# Patient Record
Sex: Female | Born: 1935 | Race: White | Hispanic: No | Smoking: Never smoker
Health system: Southern US, Community
[De-identification: ages and names within clinical notes are randomized; demographics above are authoritative.]

## PROBLEM LIST (undated history)

## (undated) DIAGNOSIS — I340 Nonrheumatic mitral (valve) insufficiency: Secondary | ICD-10-CM

## (undated) DIAGNOSIS — I272 Pulmonary hypertension, unspecified: Secondary | ICD-10-CM

## (undated) DIAGNOSIS — IMO0002 Reserved for concepts with insufficient information to code with codable children: Secondary | ICD-10-CM

## (undated) DIAGNOSIS — M810 Age-related osteoporosis without current pathological fracture: Secondary | ICD-10-CM

## (undated) DIAGNOSIS — I10 Essential (primary) hypertension: Secondary | ICD-10-CM

## (undated) HISTORY — PX: SPINAL FUSION: SHX223

## (undated) HISTORY — PX: FEMUR FRACTURE SURGERY: SHX633

---

## 1999-04-18 ENCOUNTER — Other Ambulatory Visit: Admission: RE | Admit: 1999-04-18 | Discharge: 1999-04-18 | Payer: Self-pay | Admitting: Internal Medicine

## 2000-05-20 ENCOUNTER — Encounter: Payer: Self-pay | Admitting: Gastroenterology

## 2000-05-20 ENCOUNTER — Ambulatory Visit (HOSPITAL_COMMUNITY): Admission: RE | Admit: 2000-05-20 | Discharge: 2000-05-20 | Payer: Self-pay | Admitting: Gastroenterology

## 2001-04-14 ENCOUNTER — Encounter: Admission: RE | Admit: 2001-04-14 | Discharge: 2001-04-14 | Payer: Self-pay | Admitting: Internal Medicine

## 2001-04-14 ENCOUNTER — Encounter: Payer: Self-pay | Admitting: Internal Medicine

## 2003-11-06 ENCOUNTER — Ambulatory Visit: Payer: Self-pay | Admitting: Physical Medicine & Rehabilitation

## 2003-11-06 ENCOUNTER — Inpatient Hospital Stay (HOSPITAL_COMMUNITY): Admission: EM | Admit: 2003-11-06 | Discharge: 2003-11-13 | Payer: Self-pay | Admitting: Emergency Medicine

## 2004-02-05 ENCOUNTER — Encounter: Admission: RE | Admit: 2004-02-05 | Discharge: 2004-02-05 | Payer: Self-pay | Admitting: Internal Medicine

## 2004-11-08 ENCOUNTER — Encounter: Admission: RE | Admit: 2004-11-08 | Discharge: 2004-11-08 | Payer: Self-pay | Admitting: Internal Medicine

## 2005-05-22 ENCOUNTER — Emergency Department (HOSPITAL_COMMUNITY): Admission: EM | Admit: 2005-05-22 | Discharge: 2005-05-22 | Payer: Self-pay | Admitting: Emergency Medicine

## 2005-06-06 ENCOUNTER — Encounter: Admission: RE | Admit: 2005-06-06 | Discharge: 2005-06-06 | Payer: Self-pay | Admitting: Internal Medicine

## 2005-08-27 ENCOUNTER — Encounter: Admission: RE | Admit: 2005-08-27 | Discharge: 2005-08-27 | Payer: Self-pay | Admitting: Internal Medicine

## 2005-09-18 IMAGING — CR DG CHEST 1V
1 series · 1 of 1 positions shown · non-contrast
Comparison: none

CLINICAL DATA: Fall. 
 CHEST ONE VIEW 
 An AP view of the chest dated 11/06/03 is reviewed without prior films for comparison.  The study was originally dictated on 11/06/03 but is being re-dictated on 11/13/03 due to lost report.  
 The heart is enlarged without evidence of congestive failure.  The left costophrenic area is not included on the film entirely and repeat study is suggested when the patient?s condition permits. 
 IMPRESSION
 Cardiomegaly without acute abnormality of the chest with followup exam suggested.

[view not recorded]
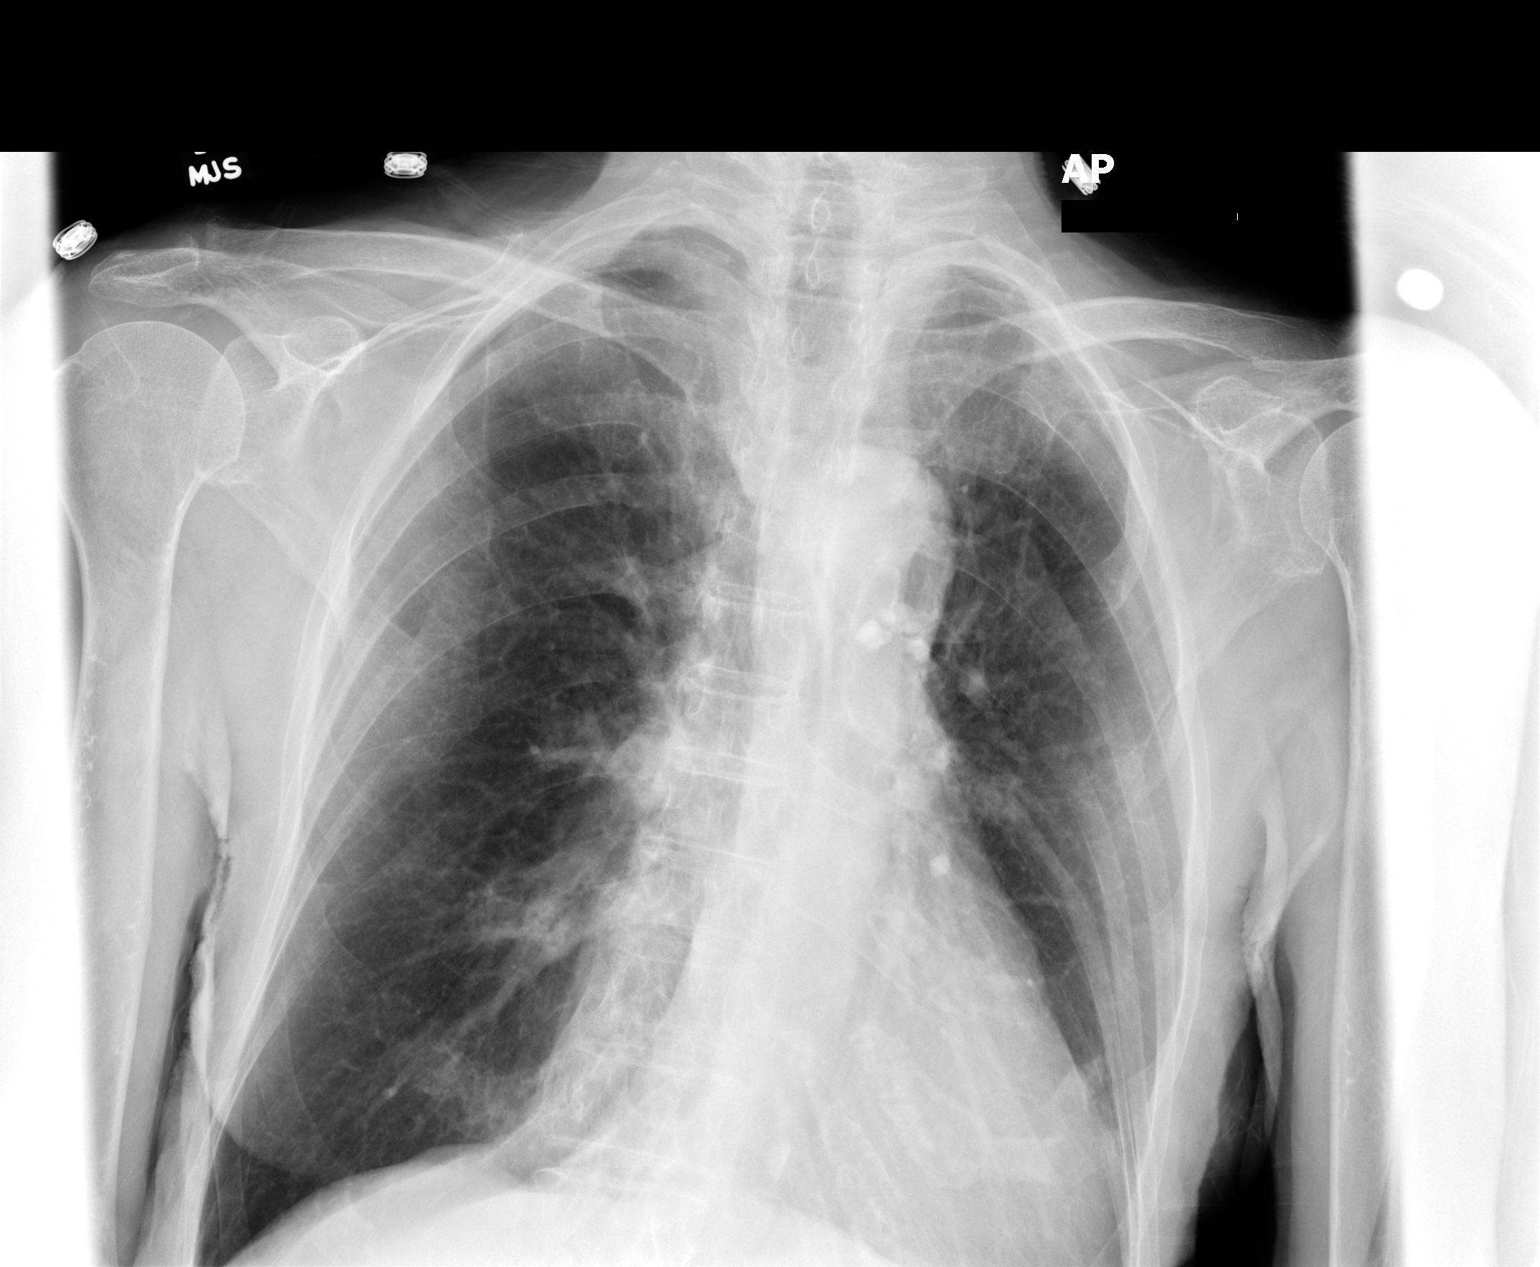

[1 of 1 positions shown; findings below may reference images not displayed]

## 2005-09-18 IMAGING — CR DG HIP (WITH OR WITHOUT PELVIS) 2-3V*L*
2 series · 2 of 2 positions shown · non-contrast
Comparison: none

CLINICAL DATA: Fall, left hip pain.
 LEFT HIP COMPLETE:
 Two views of the left hip dated 11/06/03 are redictated on 11/20/03 due to inability to retrieve the report, which was dictated on the date of the exam.  
 Note is made of fracture of the subtrochanteric portion of the femur is non displaced.  Degenerative change is noted otherwise without other acute change.

[view not recorded (1 of 2)]
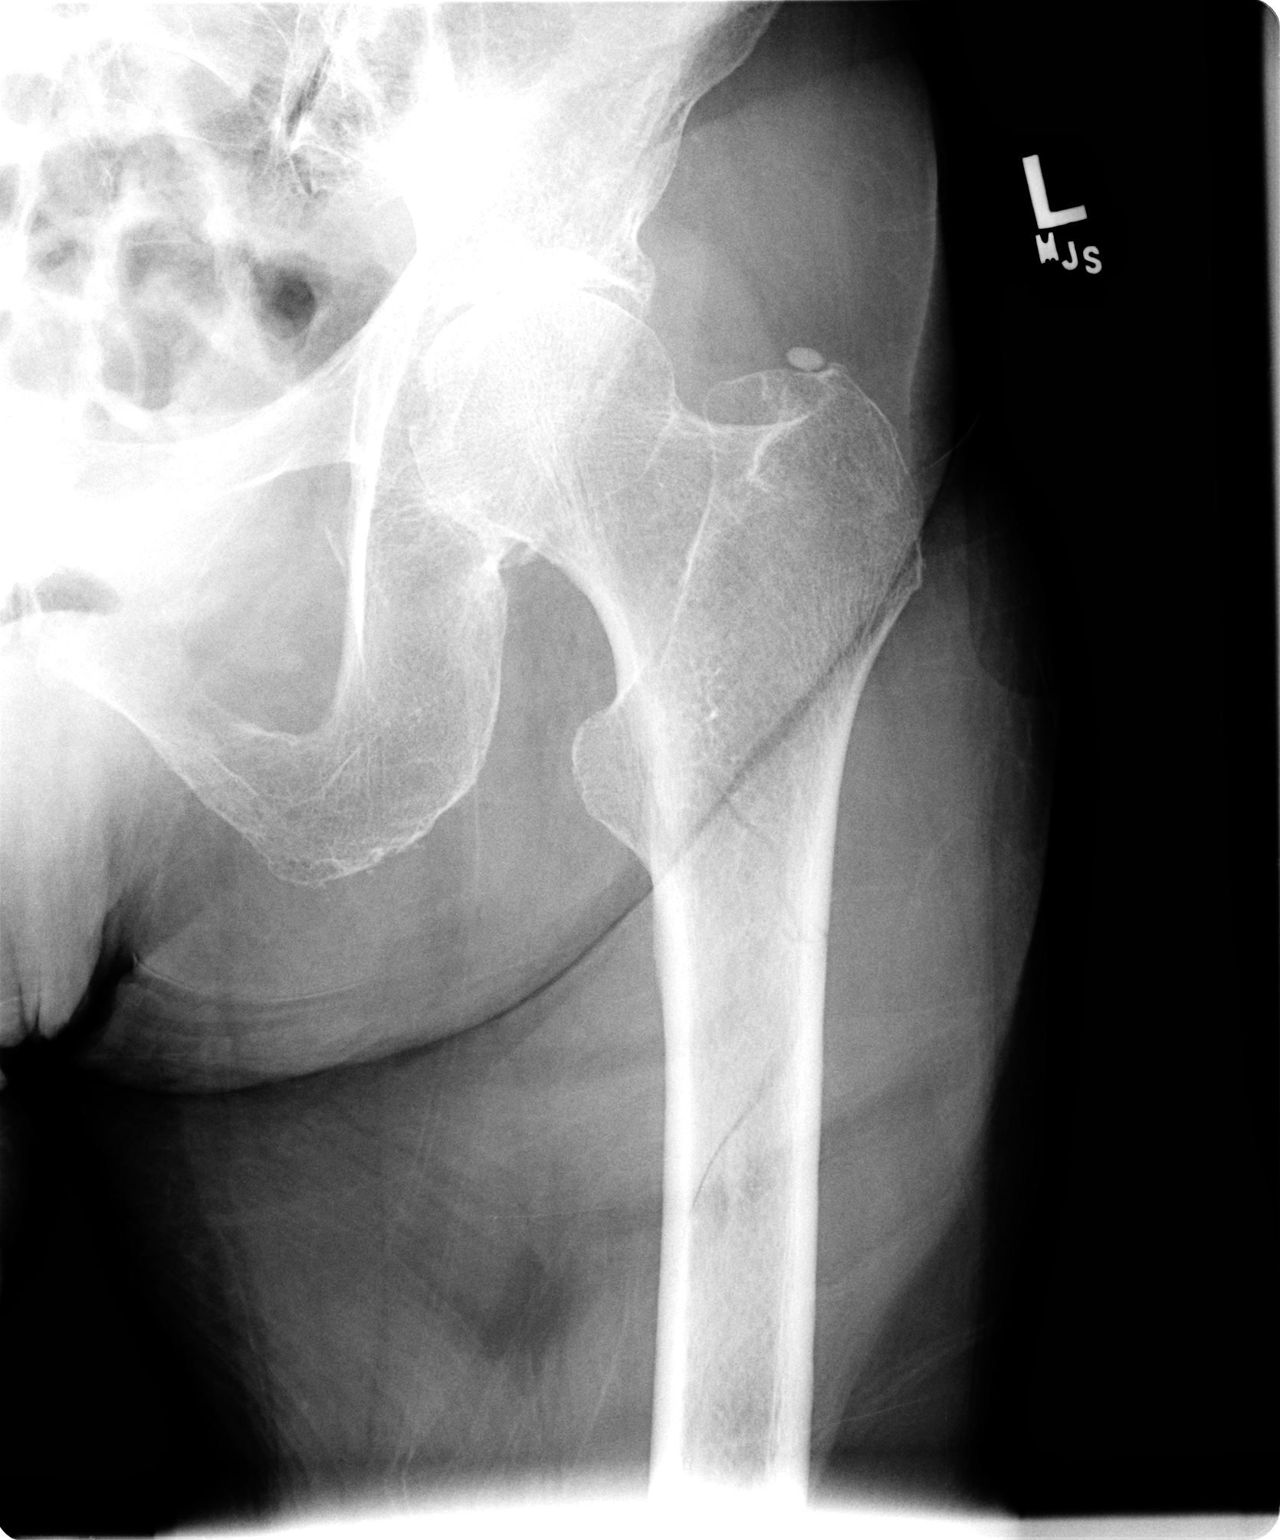

[view not recorded (2 of 2)]
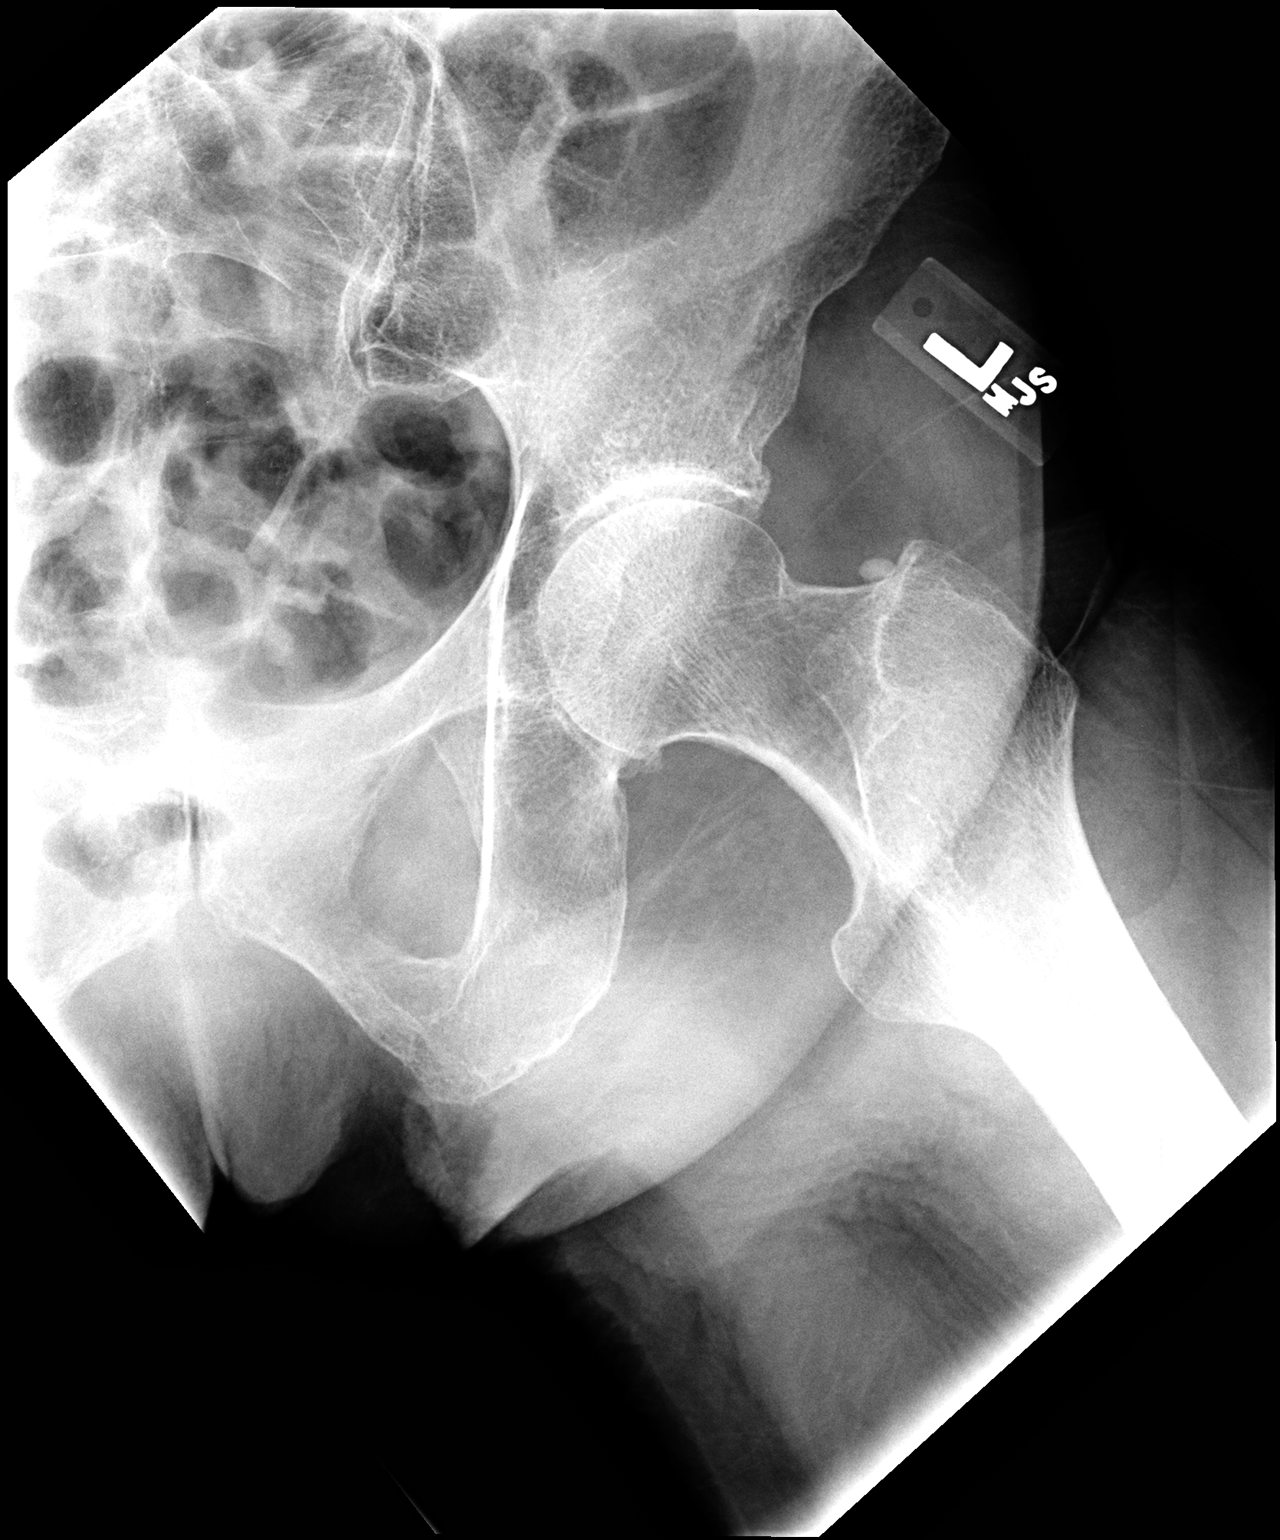

[2 of 2 positions shown; findings below may reference images not displayed]

IMPRESSION: Non-displaced subtrochanteric fracture, left femur, with discussion as above.

## 2005-09-18 IMAGING — CR DG PELVIS 1-2V
1 series · 1 of 1 positions shown · non-contrast
Comparison: none

CLINICAL DATA: Fall.  Pain in left hip. 
 PELVIS ONE TO TWO VIEWS 
 AP view of the pelvis reveals irregularity of the proximal left femur in the diaphyseal area extending into the greater trochanter region.  This is thought to represent a nondisplaced fracture.  Degenerative changes noted otherwise without other acute change. 
 IMPRESSION
 Findings thought to represent a nondisplaced fracture of the proximal left femur with discussion as above.

[view not recorded]
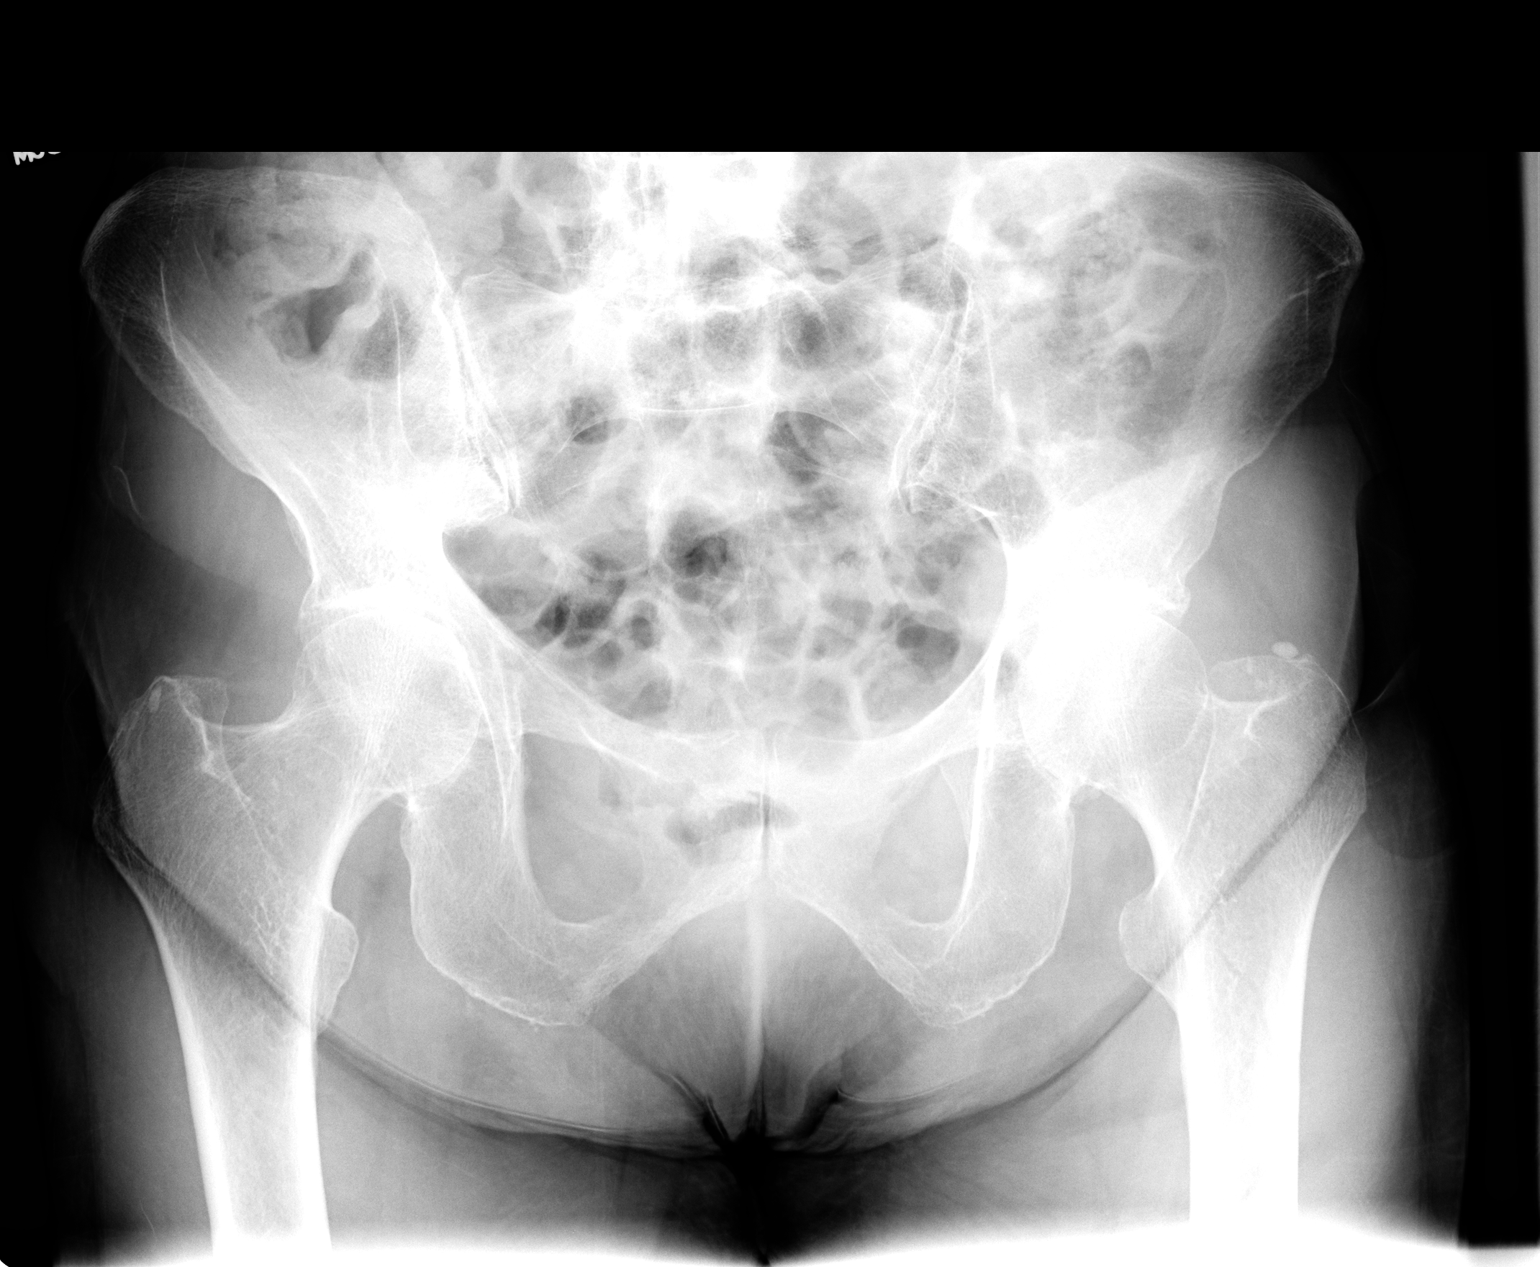

[1 of 1 positions shown; findings below may reference images not displayed]

## 2005-12-19 ENCOUNTER — Emergency Department (HOSPITAL_COMMUNITY): Admission: EM | Admit: 2005-12-19 | Discharge: 2005-12-19 | Payer: Self-pay | Admitting: Emergency Medicine

## 2005-12-29 ENCOUNTER — Encounter: Admission: RE | Admit: 2005-12-29 | Discharge: 2005-12-29 | Payer: Self-pay | Admitting: *Deleted

## 2006-05-29 ENCOUNTER — Encounter: Admission: RE | Admit: 2006-05-29 | Discharge: 2006-05-29 | Payer: Self-pay | Admitting: *Deleted

## 2007-09-14 ENCOUNTER — Other Ambulatory Visit: Payer: Self-pay

## 2007-09-14 ENCOUNTER — Ambulatory Visit: Payer: Self-pay | Admitting: Ophthalmology

## 2007-09-29 ENCOUNTER — Ambulatory Visit: Payer: Self-pay | Admitting: Ophthalmology

## 2007-11-03 ENCOUNTER — Ambulatory Visit: Payer: Self-pay | Admitting: Ophthalmology

## 2008-02-28 ENCOUNTER — Encounter: Admission: RE | Admit: 2008-02-28 | Discharge: 2008-02-28 | Payer: Self-pay | Admitting: Family Medicine

## 2008-03-11 ENCOUNTER — Emergency Department (HOSPITAL_COMMUNITY): Admission: EM | Admit: 2008-03-11 | Discharge: 2008-03-11 | Payer: Self-pay | Admitting: Emergency Medicine

## 2010-01-22 IMAGING — CR DG HIP (WITH OR WITHOUT PELVIS) 2-3V*L*
3 series · 3 of 3 positions shown · non-contrast
Comparison: Intraoperative exam 11/06/2003.

CLINICAL DATA: Left hip pain.  No new injury.

LEFT HIP - COMPLETE 2+ VIEW

[t pelvis a.p.]
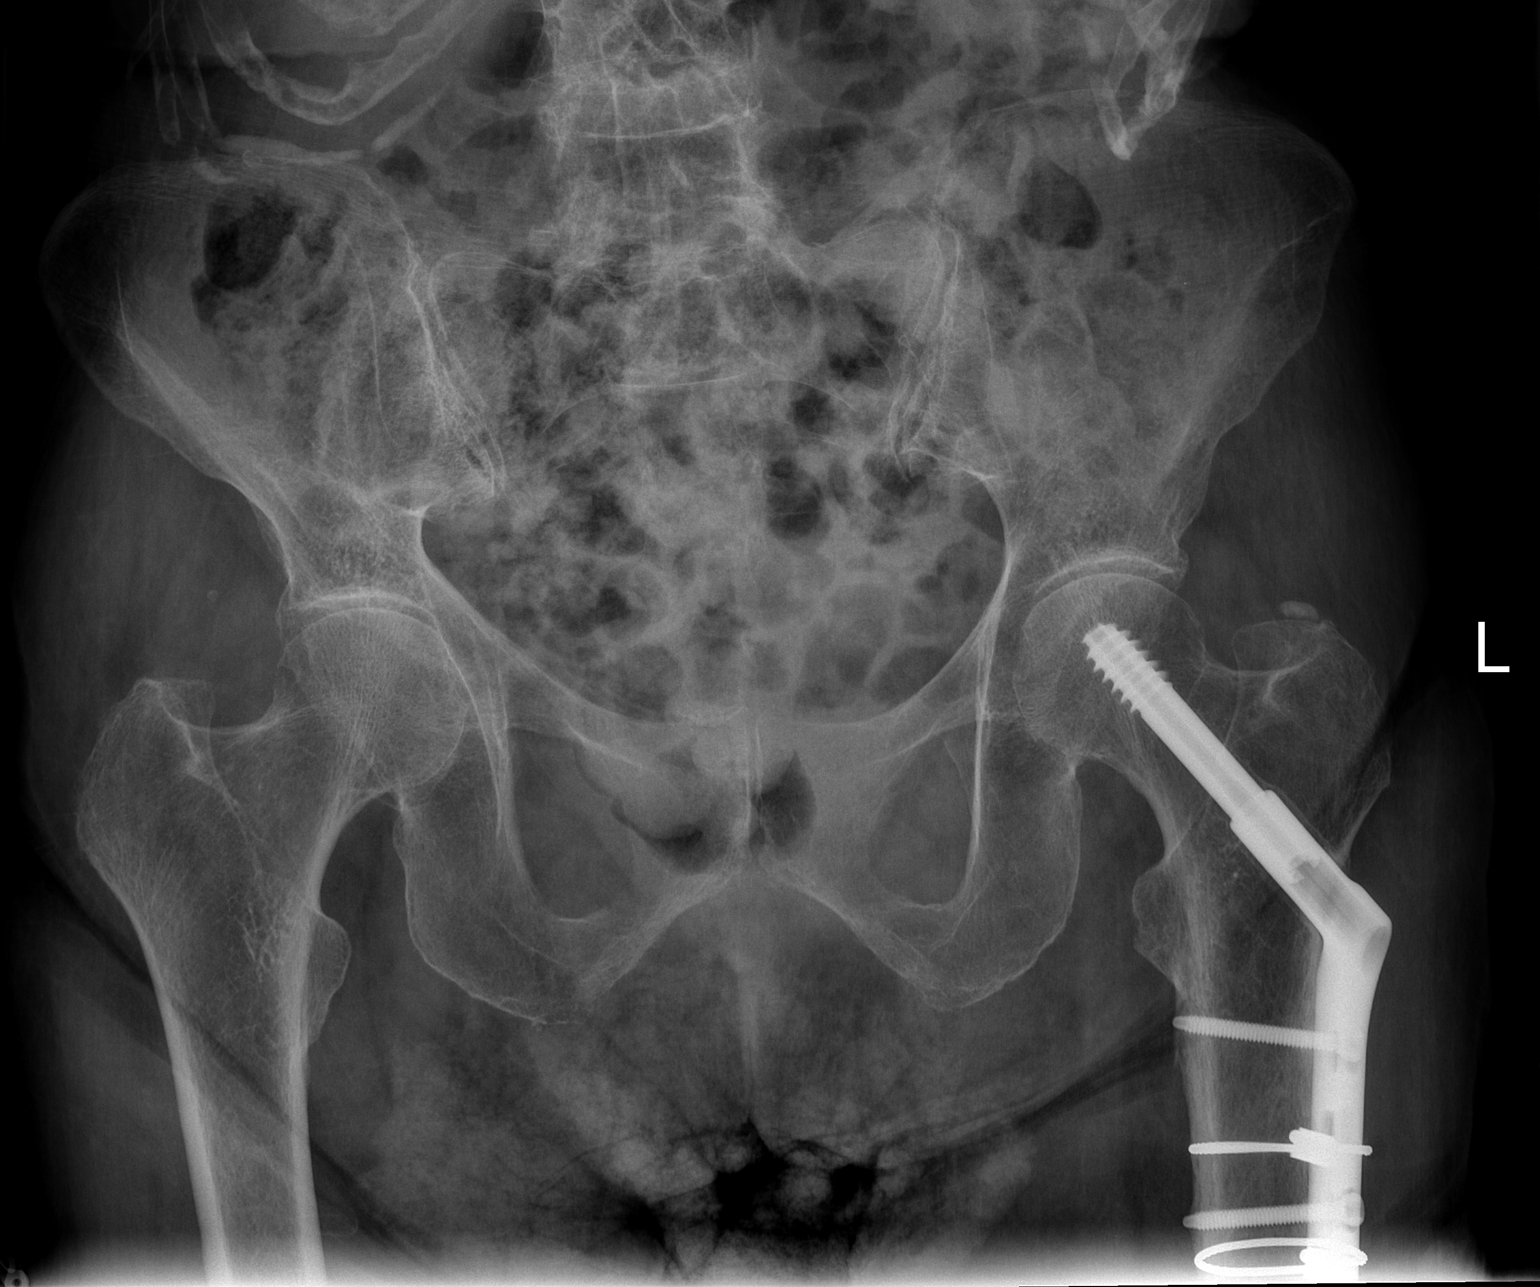

[t hip ap left]
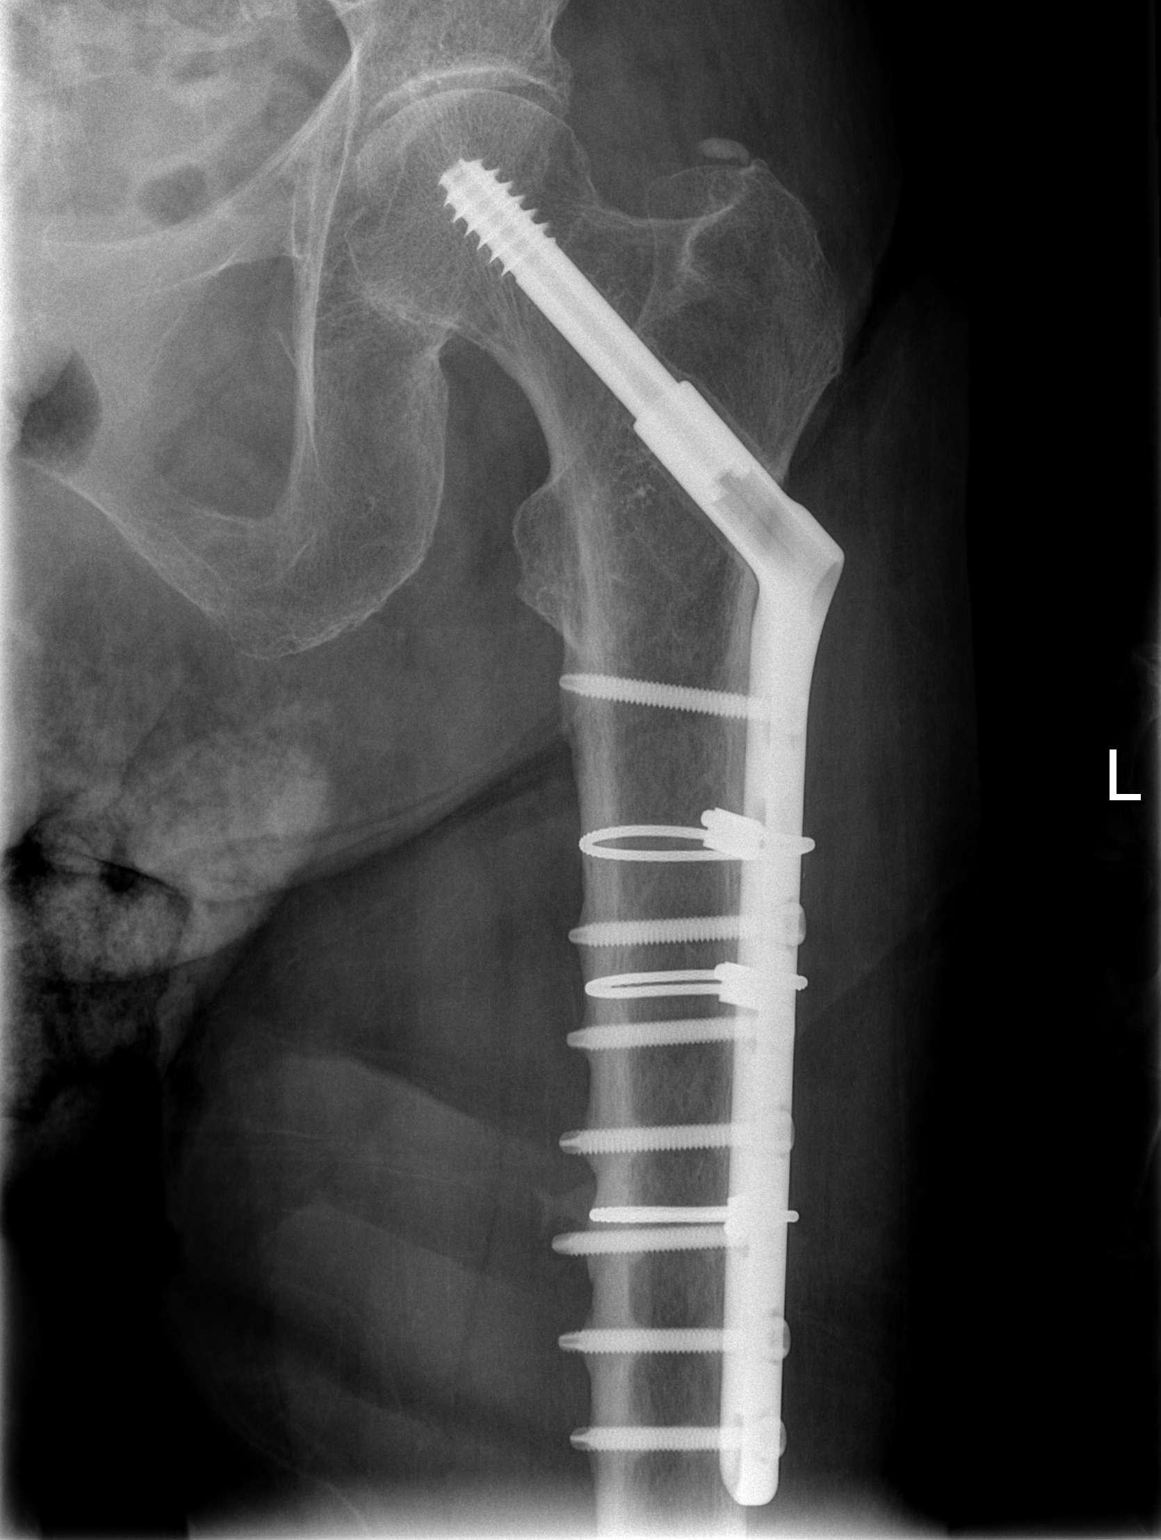

[t hip frog leg left]
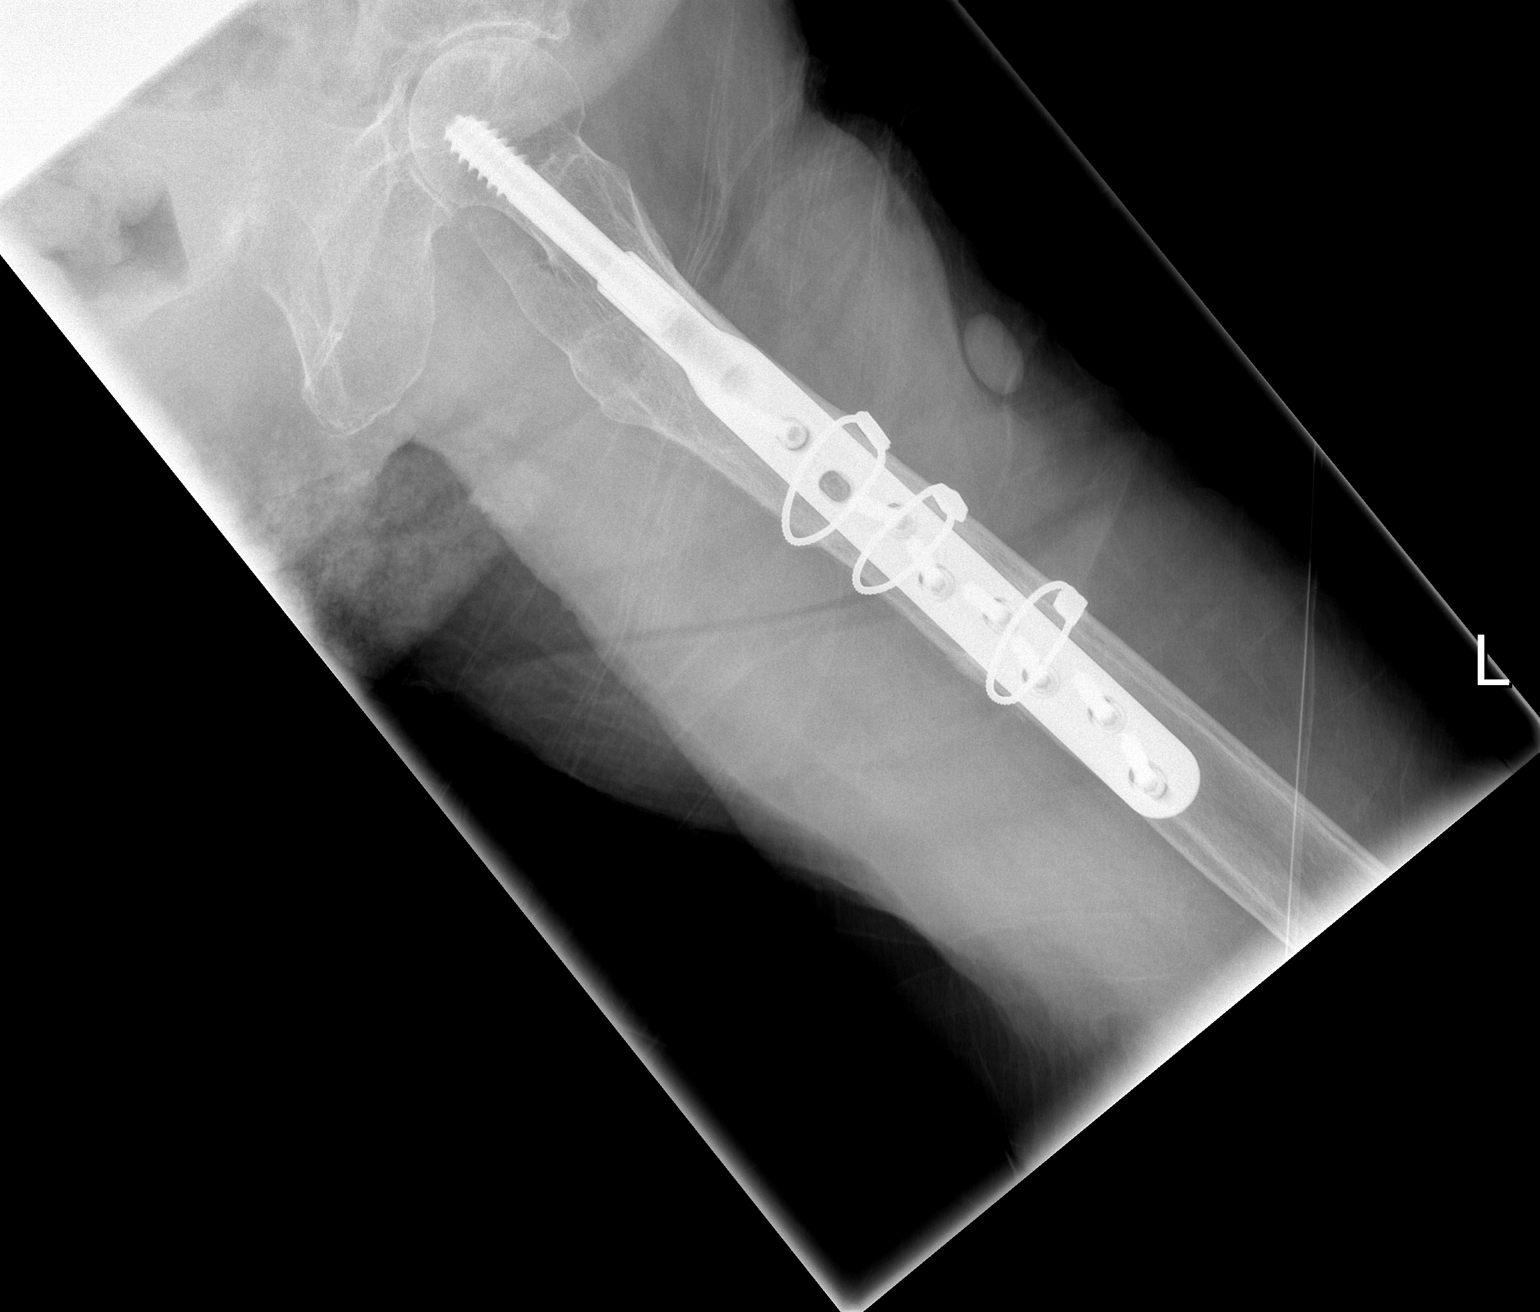

[3 of 3 positions shown; findings below may reference images not displayed]

FINDINGS: Status post placement of a dynamic compression screw left
proximal femur without findings to suggest loosening.  Osteopenic
bones without acute fracture.  Mild bilateral hip joint
degenerative changes.
IMPRESSION: Prior left hip surgery.

No acute fracture.

## 2010-02-09 ENCOUNTER — Encounter: Payer: Self-pay | Admitting: Internal Medicine

## 2010-05-07 LAB — URINALYSIS, ROUTINE W REFLEX MICROSCOPIC
Bilirubin Urine: NEGATIVE
Glucose, UA: NEGATIVE mg/dL
Hgb urine dipstick: NEGATIVE
Ketones, ur: NEGATIVE mg/dL
Nitrite: NEGATIVE
Protein, ur: NEGATIVE mg/dL
Specific Gravity, Urine: 1.011 (ref 1.005–1.030)
Urobilinogen, UA: 0.2 mg/dL (ref 0.0–1.0)
pH: 7.5 (ref 5.0–8.0)

## 2010-06-07 NOTE — Consult Note (Signed)
NAMEANNALEAH, ARATA                 ACCOUNT NO.:  [id number]   MEDICAL RECORD NO.:  [id number]          PATIENT TYPE:  INP   LOCATION:  1824                         FACILITY:  [facility]   PHYSICIAN:  Meade Maw, M.D.    DATE OF BIRTH:  02-Apr-1914   DATE OF CONSULTATION:  11/06/2003  DATE OF DISCHARGE:                                   CONSULTATION   INDICATIONS FOR CONSULT:  Preoperative evaluation prior to hip replacement.   Sharon Harrison is a very pleasant 77 year old female known to me from previous  evaluations for mitral regurgitation.  Her last echo was in July 2002 and  revealed moderately severe mitral regurgitation with normal PA pressures.  She was noted to have mild left atrial enlargement with a left atrium of  4.3.  She was last evaluated by me in 2003, she did not keep subsequent  follow up.  She states she has been in her usual state of health.  She has  been active.  [place]  approximately one mile yesterday.  She has had no change in her exercise  tolerance.  She has had no chest pain, no shortness of breath, no orthopnea,  no palpitations, no tachy arrhythmias.  She does have pedal edema which has  been chronic in nature and for which she wears compression stockings.   PAST MEDICAL HISTORY:  As noted above, significant for  1.  Mitral valve prolapse.  2.  Hypertension.  3.  Osteoporosis.  4.  Over active bladder.  5.  Dermatitis.   PAST SURGICAL HISTORY:  Lumbar spinal fusion and vein stripping, she had no  complications with these procedures.   CURRENT MEDICATIONS:  OS-Cal 500 mg daily, E-Vista 60 mg daily, Lisinopril 5  mg in the morning, Detrol in the morning, aspirin 81 mg daily, Tums b.i.d.,  and multi-vitamins.   ALLERGIES:  Codeine.   REVIEW OF SYMPTOMS:  As noted above.   SOCIAL HISTORY:  She has been widowed two years.  She lives independently.  She does not drive.  No history of tobacco, a small glass  of wine prior to  dinner.   PHYSICAL EXAMINATION:  GENERAL:  Elderly female in no acute distress except for the pain in her  left hip.  VITAL SIGNS:  Blood pressure 156/85, heart rate 81, respiratory rate 18.  She is afebrile.  NEUROLOGICAL:  Nonfocal, conversation appropriate.  HEENT:  Unremarkable.  NECK:  There is no carotid bruits, no neck vein distention.  PULMONARY EXAM:  Breath sounds which are equal and clear to auscultation.  No use of accessory muscles.  CARDIOVASCULAR EXAM:  Normal S1 and normal S2, there is a 2/6 systolic  murmur most noted actually at the left lower sternal border.  PMI is not  displaced.  ABDOMEN:  Soft, benign, nontender, no unusual bruits or pulsations noted.  EXTREMITIES:  Distal pulses are equal and palpable.  SKIN:  Warm and dry.   LABORATORY DATA:  Hematocrit 40.5, platelet count 196, white count 10.7.  Potassium 3.1, creatinine 0.8, glucose 127.  Liver function tests normal.  ECG reveals normal sinus rhythm with late R wave progression unchanged from  2002.   IMPRESSION:  39.  77 year old female with known moderate to moderately severe mitral      regurgitation with normal PA pressures.  She does have mild left atrial      enlargement.  In view of her advanced age, hypertension, mitral      regurgitation, and mild left atrial enlargement, she will be at      increased risk of atrial arrhythmias, therefore, would recommend      potassium supplementation and beta block burst during the perioperative      period.  No further cardiac workup needs to be performed at this time.      Close attention to fluid shifts and she will be at a higher risk of CHF      exacerbation in the postoperative period, as well.  2.  Hypertension, blood pressure marginally control, recommend close      attention to pain management.  3.  Health maintenance, if she has not had a recent cholesterol profile,      this will need to be performed.      Hele   HP/MEDQ   D:  11/06/2003  T:  11/06/2003  Job:  [id number]   cc:   Darius Bump, M.D.  [place]  [place] [address]  Fax: [phone]   Lenard Galloway. Chaney Malling, M.D.  [address] [place]  [place] [address]  Fax: [phone]

## 2010-06-07 NOTE — Op Note (Signed)
Sharon Harrison, Sharon Harrison                 ACCOUNT NO.:  [id number]   MEDICAL RECORD NO.:  [id number]          PATIENT TYPE:  INP   LOCATION:  1824                         FACILITY:  [facility]   PHYSICIAN:  Rodney A. Mortenson, M.D.DATE OF BIRTH:  1914/07/10   DATE OF PROCEDURE:  11/06/2003  DATE OF DISCHARGE:                                 OPERATIVE REPORT   PREOPERATIVE DIAGNOSIS:  Nondisplaced subtrochanteric fracture, left hip.   POSTOPERATIVE DIAGNOSIS:  Nondisplaced subtrochanteric fracture, left hip.   OPERATION PERFORMED:  Open reduction internal fixation, subtrochanteric  fracture, left hip using 8-hole side plate with 161 degree compression screw  and three Dall-Miles cables.   SURGEON:  Lenard Galloway. Chaney Malling, M.D.   ASSISTANT:  Legrand Pitts. Duffy, P.A.   ANESTHESIA:  General.   DESCRIPTION OF PROCEDURE:  After satisfactory general anesthesia the patient  was placed on the fracture table in the supine position.  The foot was  placed in traction device.  The left thigh and hip was then prepped with  Betadine and then draped out in the usual manner.  A barrier drape was  placed over the hip and held up with curtain rods.  Throughout the procedure  the C-arm was used.  An incision was made starting at about the level of the  greater trochanter and carried distally for about 8 inches.  Skin edges were  retracted, bleeders were coagulated.  Tensor fascia lata was split as were  the fibers of the vastus lateralis.  The lateral cortex of the femur was  then exposed with a Cobb.  Drill hole was placed at the level of  the lesser  trochanter and a guide pin was passed up the femoral neck at about 135  degree angle using angled guide.  The step cut cortical drill was then used  to drill up into the femoral neck.  A 75 mm compression screw was passed  over the guide pin and an 8-hole 135 degree side plate was applied to the  compression screw.  This held up flush against the femur with a [place]  claw  clamp.  Drill holes were placed and appropriate length screws were inserted.  Three of the screws did not obtain good purchase as they were through the  nondisplaced fracture line.  Proximally and distally there was good fixation  with the titanium screws.  Three Dall-Miles cables were then placed around  the femur and the side plate in standard fashion, tightened up snugly and  excess cable was cut.  Excellent fixation was achieved.  Throughout the  procedure, radiographs were used to stabilize and position all the  components.  The fibers of the vastus lateralis were closed with a running  Vicryl suture.  Tycron was used in an interrupted manner to close the tensor  fascia lata and Vicryl was used to close the subcutaneous tissue.  Stainless  steel staples used to close the skin.  Sterile dressings were applied with a  6 inch Ace  and the patient was then transferred to the recovery room in excellent  condition.  Technically this  procedure went extremely well.   ESTIMATED BLOOD LOSS:  Approximately 500 mL.       RAM/MEDQ  D:  11/06/2003  T:  11/07/2003  Job:  [id number]

## 2010-06-07 NOTE — Procedures (Signed)
Sharon Harrison. [facility]  Patient:    Sharon Harrison, Sharon Harrison                          MRN: [id number] Proc. Date: 05/20/00 Attending:  Verlin Harrison, M.D. CC:         Sharon Harrison, M.D.   Procedure Report  DATE OF BIRTH:  1914-01-26.  REFERRING PHYSICIAN:  Julieanne Harrison, M.D.  PROCEDURE PERFORMED:  Flexible proctosigmoidoscopy.  ENDOSCOPIST:  Sharon Harrison, M.D.  INDICATIONS FOR PROCEDURE:  The patient is an 77 year old female who is referred for surveillance colonoscopy and possible polypectomy to prevent colon cancer.  She denies a personal or family history of colon cancer.  She reports normal bowel movements and denies gastrointestinal bleeding.  I discussed with the patient the complications associated with colonoscopy and polypectomy including a one per 1000 risk of bleeding and a 4 per 1000 risk of colon rupture requiring emergency surgery.  I also explained to Sharon Harrison the alternative to colonoscopy which includes flexible proctosigmoidoscopy followed by air contrast barium enema.  Sharon Harrison expressed the desire to have the least risky exam of her colon which would include a flexible sigmoidoscopy and air contrast barium enema.  PREMEDICATION:  None.  ENDOSCOPE:  DESCRIPTION OF PROCEDURE:  After obtaining informed consent, the patient was placed in the left lateral decubitus position.  Anal inspection was normal. Digital rectal exam was normal.  The Olympus pediatric video colonoscope was then introduced into the rectum and easily advance to 65 cm from the anal verge.  Endoscopic appearance of the rectum and left colon to 65 cm from the anal verge was completely normal.  There was no  endoscopic evidence for the presence of colorectal neoplasia, inflammatory bowel disease or proctocolonic pathology.  ASSESSMENT:  Normal surveillance flexible proctosigmoidoscopy to 65 cm from the anal verge.  PLAN:  Air contrast  barium enema. DD:  05/20/00 TD:  05/20/00 Job: [id number]

## 2010-06-07 NOTE — Discharge Summary (Signed)
Sharon Harrison, Sharon Harrison                 ACCOUNT NO.:  [id number]   MEDICAL RECORD NO.:  [id number]          PATIENT TYPE:  INP   LOCATION:  5035                         FACILITY:  [facility]   PHYSICIAN:  Rodney A. Mortenson, M.D.DATE OF BIRTH:  June 25, 1914   DATE OF ADMISSION:  11/06/2003  DATE OF DISCHARGE:  11/13/2003                                 DISCHARGE SUMMARY   ADMITTING DIAGNOSES:  1.  Nondisplaced left proximal femur fracture.  2.  Osteoporosis.  3.  Hypertension.  4.  Heart murmur.  5.  Osteoarthritis.  6.  Overactive bladder.  7.  Dermatitis.   DISCHARGE DIAGNOSES:  1.  Nondisplaced left proximal femur fracture status post open reduction      internal fixation.  2.  Acute blood loss anemia secondary to surgery.  3.  Postoperative nausea due to medications, now resolved.  4.  Possible urinary tract infection.  5.  Osteoporosis.  6.  Hypertension.  7.  Heart murmur/mitral regurgitation.  8.  Osteoarthritis.  9.  Overactive bladder.  10. Dermatitis.   SURGICAL PROCEDURE:  On November 06, 2003 Ms. Hruska underwent a compression  screw fixation with eight-hole side-plate for left proximal femur fracture  by Dr. Rinaldo Ratel, assisted by Arnoldo Morale, P.A.-C.   COMPLICATIONS:  None.   CONSULTS:  1.  Cardiology consult for preoperative clearance obtained on November 06, 2003 by Dr. Meade Maw.  2.  Case management and rehabilitation medicine consult and physical therapy      consult November 07, 2003.  3.  Occupational therapy consult November 08, 2003.   HISTORY OF PRESENT ILLNESS:  This 77 year old white female patient was going  to the bathroom on the morning of admission when she missed the toilet and  slid, landing on the left side on the floor.  She was able to pull herself  up onto the toilet, but was unable to get off the toilet and bear weight on  that left leg.  She was able to call for help and was brought to the  emergency department where she was  found to have a nondisplaced left  proximal femur fracture.  She is being admitted for surgical fixation of the  fracture.   HOSPITAL COURSE:  Upon admission cardiology consult was obtained for  preoperative clearance.  They felt she was stable for surgery but they  recommended some medications and telemetry postoperatively.  Their  recommendations were followed.  She subsequently underwent surgery later  that day and tolerated it well.  Postoperative day one she did have some  nausea and vomiting due to the medications.  They were held.  Hemoglobin  dropped to 9.1 with hematocrit of 25.8.  Left leg was neurovascularly intact  and dressing was intact.  She was switched to p.o. pain medications and  started on therapy per protocol.   On postoperative day two she still had continuing nausea and dizziness.  Her  hemoglobin had dropped to 7.2 with hematocrit of 20.3.  She was subsequently  transfused with 2 units of packed red blood cells.  She was continued on  telemetry at that time.   Postoperative day #3 she was feeling better, but complained that she felt  like she was not swallowing appropriately.  This was monitored.  She was  switched from Percocet to Darvocet for pain.  Her Arixtra was resumed after  it was held due to the low hemoglobin the day before.  Pulse is 78,  respirations 18, blood pressure 119/68.  Hemoglobin was 8.7.  It was felt  she was still low enough that she required another unit of blood and she was  transfused with 1 unit of packed red blood cells.   Postoperative day #4 she was feeling better.  Hemoglobin was now 10.5 with  hematocrit of 29.1.  Because of continued complaints of swallowing  difficulty, a swallow study was obtained.  That was completed on October 22.  There was no definite esophageal dysfunction.  The patient felt better after  that study was done and felt that the problem had resolved.   She continued to make slow progress with therapy over  the next several days.  Cardiology was able to sign off and she remained medically stable.  On  October 23 she was still having difficulty after Foley was discontinued with  urinary frequency.  A UA was obtained which showed moderate leukocyte  esterase, many bacteria, 7-10 white cells, 0-2 red cells.  Unsure whether  this is contaminated or not due to lack of epithelial cells noted in the  laboratory report.  Because of the hardware in place and the recent Foley  catheterization, we will treat her for a urinary tract infection.  It is  felt she is ready for discharge to the skilled nursing facility today and  she will be transferred there later today.   DISCHARGE INSTRUCTIONS:   DIET:  She can resume her regular diet.   MEDICATIONS:  1.  Os-Cal 500 mg one tablet p.o. q.a.m.  2.  Tums one tablet p.o. at 12 noon and at 6 p.m.  3.  Multivitamin with iron one tablet p.o. q.a.m.  4.  Evista 60 mg one tablet p.o. q.a.m.  5.  Arixtra 2.5 mg subcutaneous q.a.m. with the last dose to be November 14, 2003.  Once her Arixtra is completed she needs to be on one baby aspirin      a day for a total of one month so that should start on the 26th of      October.  6.  Prinivil 5 mg one tablet p.o. q.a.m.  7.  Detrol 2 mg one tablet p.o. b.i.d.  8.  Macrobid 100 mg p.o. b.i.d. for a total of seven days.  First dose will      be given on October 24.  9.  Protonix 20 mg p.o. q.a.m.  10. Trinsicon one tablet p.o. q.a.m.  11. Enema of choice/laxative of choice as needed for constipation.  12. Tylenol one to two tablets p.o. q.4h. p.r.n. for pain or temperature.  13. Robaxin 500 mg one to two tablets p.o. q.6h. p.r.n. for spasms.  14. Darvocet-N 100 one to two tablets p.o. q.4-6h. p.r.n. for pain.   ACTIVITY:  She is to be out of bed, touchdown weightbearing only on that  left leg for a total of six weeks after surgery.  She needs to ambulate with a walker.  Continue PT and OT per rehabilitation  protocols.   WOUND CARE:  Please clean the left hip incision with  Betadine daily and  apply a dry dressing.  She needs to follow up with Dr. Chaney Malling in our  office next Monday for staple removal and first wound postoperative check.  Please notify Dr. Chaney Malling of temperature greater than 101.5, chills, pain  unrelieved by pain medications, or foul smelling drainage from the wound.   FOLLOWUP:  She needs to follow up with Dr. Chaney Malling in our office on  November 20, 2003.  Please call [phone] for that appointment and that is a  week from today.  She needs follow-up with her cardiologist per their  office.   LABORATORY DATA:  Intraoperative C-arm film done on November 06, 2003 showed  a compression screw placed across the femoral neck and elongated lateral  plate fixation also present with cerclage wires and screws transfixing the  subtrochanteric proximal left femur fracture.  Alignment appears anatomic.   On October 17 white count 10.7, on the 18th white count 11.4, hemoglobin  9.1, hematocrit 25.8.  Hemoglobin dropped to a low of 7.2 and 20.3 with  platelet count of 147 on October 19 and then on October 21 white count 7.6,  hemoglobin 10.5, hematocrit 29.1, and platelets 162.   On October 17 potassium was 3.1, glucose 127, total protein 5.7.  On the  18th sodium 133, potassium 4.1, glucose 151, calcium 7.4.  On October 19  sodium 131, potassium 3.9, chloride 105, CO2 22, glucose 135, BUN 11,  creatinine 0.8, calcium 7.4.  Urinalysis upon admission showed 15 mg/dl of  ketones.  Urinalysis done on November 12, 2003 showed negative nitrate,  moderate leukocyte esterase, many bacteria, 7-10 white cells, 0-2 red cells,  and all other laboratory studies were within normal limits.       KED/MEDQ  D:  11/13/2003  T:  11/13/2003  Job:  [id number]   cc:   Meade Maw, M.D.  [address]  [place]  [place] [address]  Fax: [phone]   Marcene Duos, M.D.  [id number]  [place]  [place] [address]  Fax: [phone]

## 2011-11-16 ENCOUNTER — Emergency Department (HOSPITAL_COMMUNITY): Payer: Medicare Other

## 2011-11-16 ENCOUNTER — Inpatient Hospital Stay (HOSPITAL_COMMUNITY)
Admission: EM | Admit: 2011-11-16 | Discharge: 2011-11-27 | DRG: 982 | Disposition: A | Discharge status: Skilled Nursing Facility | Payer: Medicare Other | Attending: Internal Medicine | Admitting: Internal Medicine

## 2011-11-16 ENCOUNTER — Inpatient Hospital Stay (HOSPITAL_COMMUNITY): Payer: Medicare Other

## 2011-11-16 ENCOUNTER — Encounter (HOSPITAL_COMMUNITY): Payer: Self-pay | Admitting: *Deleted

## 2011-11-16 DIAGNOSIS — I10 Essential (primary) hypertension: Secondary | ICD-10-CM | POA: Diagnosis present

## 2011-11-16 DIAGNOSIS — E871 Hypo-osmolality and hyponatremia: Secondary | ICD-10-CM | POA: Diagnosis present

## 2011-11-16 DIAGNOSIS — R58 Hemorrhage, not elsewhere classified: Secondary | ICD-10-CM | POA: Diagnosis present

## 2011-11-16 DIAGNOSIS — T1490XA Injury, unspecified, initial encounter: Secondary | ICD-10-CM

## 2011-11-16 DIAGNOSIS — Z87311 Personal history of (healed) other pathological fracture: Secondary | ICD-10-CM

## 2011-11-16 DIAGNOSIS — N9489 Other specified conditions associated with female genital organs and menstrual cycle: Secondary | ICD-10-CM | POA: Diagnosis present

## 2011-11-16 DIAGNOSIS — W010XXA Fall on same level from slipping, tripping and stumbling without subsequent striking against object, initial encounter: Secondary | ICD-10-CM | POA: Diagnosis present

## 2011-11-16 DIAGNOSIS — I340 Nonrheumatic mitral (valve) insufficiency: Secondary | ICD-10-CM

## 2011-11-16 DIAGNOSIS — S32591A Other specified fracture of right pubis, initial encounter for closed fracture: Secondary | ICD-10-CM | POA: Diagnosis present

## 2011-11-16 DIAGNOSIS — M810 Age-related osteoporosis without current pathological fracture: Secondary | ICD-10-CM | POA: Diagnosis present

## 2011-11-16 DIAGNOSIS — I4891 Unspecified atrial fibrillation: Secondary | ICD-10-CM | POA: Diagnosis present

## 2011-11-16 DIAGNOSIS — F0390 Unspecified dementia without behavioral disturbance: Secondary | ICD-10-CM | POA: Diagnosis present

## 2011-11-16 DIAGNOSIS — E876 Hypokalemia: Secondary | ICD-10-CM | POA: Diagnosis not present

## 2011-11-16 DIAGNOSIS — E236 Other disorders of pituitary gland: Secondary | ICD-10-CM | POA: Diagnosis present

## 2011-11-16 DIAGNOSIS — D72829 Elevated white blood cell count, unspecified: Secondary | ICD-10-CM | POA: Diagnosis present

## 2011-11-16 DIAGNOSIS — Z981 Arthrodesis status: Secondary | ICD-10-CM

## 2011-11-16 DIAGNOSIS — S32509A Unspecified fracture of unspecified pubis, initial encounter for closed fracture: Principal | ICD-10-CM | POA: Diagnosis present

## 2011-11-16 DIAGNOSIS — E222 Syndrome of inappropriate secretion of antidiuretic hormone: Secondary | ICD-10-CM

## 2011-11-16 DIAGNOSIS — D62 Acute posthemorrhagic anemia: Secondary | ICD-10-CM | POA: Diagnosis not present

## 2011-11-16 DIAGNOSIS — Z681 Body mass index (BMI) 19 or less, adult: Secondary | ICD-10-CM

## 2011-11-16 DIAGNOSIS — Z66 Do not resuscitate: Secondary | ICD-10-CM | POA: Diagnosis present

## 2011-11-16 DIAGNOSIS — Y9210 Unspecified residential institution as the place of occurrence of the external cause: Secondary | ICD-10-CM | POA: Diagnosis present

## 2011-11-16 DIAGNOSIS — E46 Unspecified protein-calorie malnutrition: Secondary | ICD-10-CM | POA: Diagnosis present

## 2011-11-16 DIAGNOSIS — I272 Pulmonary hypertension, unspecified: Secondary | ICD-10-CM

## 2011-11-16 DIAGNOSIS — S32592A Other specified fracture of left pubis, initial encounter for closed fracture: Secondary | ICD-10-CM | POA: Diagnosis present

## 2011-11-16 HISTORY — DX: Essential (primary) hypertension: I10

## 2011-11-16 HISTORY — DX: Reserved for concepts with insufficient information to code with codable children: IMO0002

## 2011-11-16 HISTORY — DX: Nonrheumatic mitral (valve) insufficiency: I34.0

## 2011-11-16 HISTORY — DX: Pulmonary hypertension, unspecified: I27.20

## 2011-11-16 HISTORY — DX: Age-related osteoporosis without current pathological fracture: M81.0

## 2011-11-16 LAB — CBC WITH DIFFERENTIAL/PLATELET
Basophils Absolute: 0 10*3/uL (ref 0.0–0.1)
Basophils Relative: 0 % (ref 0–1)
Eosinophils Absolute: 0 10*3/uL (ref 0.0–0.7)
Eosinophils Relative: 0 % (ref 0–5)
HCT: 31.7 % — ABNORMAL LOW (ref 36.0–46.0)
Hemoglobin: 11.1 g/dL — ABNORMAL LOW (ref 12.0–15.0)
Lymphocytes Relative: 6 % — ABNORMAL LOW (ref 12–46)
Lymphs Abs: 1.2 10*3/uL (ref 0.7–4.0)
MCH: 30.8 pg (ref 26.0–34.0)
MCHC: 35 g/dL (ref 30.0–36.0)
MCV: 88.1 fL (ref 78.0–100.0)
Monocytes Absolute: 1.4 10*3/uL — ABNORMAL HIGH (ref 0.1–1.0)
Monocytes Relative: 7 % (ref 3–12)
Neutro Abs: 18 10*3/uL — ABNORMAL HIGH (ref 1.7–7.7)
Neutrophils Relative %: 87 % — ABNORMAL HIGH (ref 43–77)
Platelets: 190 10*3/uL (ref 150–400)
RBC: 3.6 MIL/uL — ABNORMAL LOW (ref 3.87–5.11)
RDW: 15.4 % (ref 11.5–15.5)
WBC: 20.5 10*3/uL — ABNORMAL HIGH (ref 4.0–10.5)

## 2011-11-16 LAB — BASIC METABOLIC PANEL
BUN: 32 mg/dL — ABNORMAL HIGH (ref 6–23)
CO2: 20 mEq/L (ref 19–32)
Calcium: 9.7 mg/dL (ref 8.4–10.5)
Chloride: 99 mEq/L (ref 96–112)
Creatinine, Ser: 1.02 mg/dL (ref 0.50–1.10)
GFR calc Af Amer: 53 mL/min — ABNORMAL LOW (ref >90)
GFR calc non Af Amer: 46 mL/min — ABNORMAL LOW (ref >90)
Glucose, Bld: 151 mg/dL — ABNORMAL HIGH (ref 70–99)
Potassium: 3.8 mEq/L (ref 3.5–5.1)
Sodium: 134 mEq/L — ABNORMAL LOW (ref 135–145)

## 2011-11-16 LAB — CBC
Hemoglobin: 9.4 g/dL — ABNORMAL LOW (ref 12.0–15.0)
MCH: 31 pg (ref 26.0–34.0)
MCV: 87.5 fL (ref 78.0–100.0)
Platelets: 148 10*3/uL — ABNORMAL LOW (ref 150–400)
RBC: 3.03 MIL/uL — ABNORMAL LOW (ref 3.87–5.11)

## 2011-11-16 LAB — URINALYSIS, ROUTINE W REFLEX MICROSCOPIC
Bilirubin Urine: NEGATIVE
Glucose, UA: NEGATIVE mg/dL
Hgb urine dipstick: NEGATIVE
Ketones, ur: NEGATIVE mg/dL
Leukocytes, UA: NEGATIVE
Nitrite: NEGATIVE
Protein, ur: NEGATIVE mg/dL
Specific Gravity, Urine: 1.018 (ref 1.005–1.030)
Urobilinogen, UA: 0.2 mg/dL (ref 0.0–1.0)
pH: 5.5 (ref 5.0–8.0)

## 2011-11-16 LAB — APTT: aPTT: 30 seconds (ref 24–37)

## 2011-11-16 LAB — PROTIME-INR: Prothrombin Time: 14.6 seconds (ref 11.6–15.2)

## 2011-11-16 LAB — TROPONIN I: Troponin I: <0.3 ng/mL (ref <0.30)

## 2011-11-16 LAB — RETICULOCYTES: RBC.: 3.03 MIL/uL — ABNORMAL LOW (ref 3.87–5.11)

## 2011-11-16 MED ORDER — ACETAMINOPHEN 650 MG RE SUPP: 650.0000 mg | Freq: Four times a day (QID) | RECTAL | Status: DC | PRN

## 2011-11-16 MED ORDER — ONDANSETRON HCL 4 MG/2ML IJ SOLN: 4.0000 mg | Freq: Four times a day (QID) | INTRAMUSCULAR | Status: DC | PRN

## 2011-11-16 MED ORDER — MIDAZOLAM HCL 2 MG/2ML IJ SOLN
INTRAMUSCULAR | Status: AC
  Administered 2011-11-16: 0.5 mg
  Filled 2011-11-16: qty 2

## 2011-11-16 MED ORDER — LIDOCAINE HCL 1 % IJ SOLN
INTRAMUSCULAR | Status: AC
  Filled 2011-11-16: qty 20

## 2011-11-16 MED ORDER — ACETAMINOPHEN 325 MG PO TABS: 650.0000 mg | ORAL_TABLET | Freq: Four times a day (QID) | ORAL | Status: DC | PRN

## 2011-11-16 MED ORDER — SODIUM CHLORIDE 0.9 % IV SOLN
INTRAVENOUS | Status: AC
  Administered 2011-11-17: 1000 mL via INTRAVENOUS

## 2011-11-16 MED ORDER — DOCUSATE SODIUM 100 MG PO CAPS
100.0000 mg | ORAL_CAPSULE | Freq: Two times a day (BID) | ORAL | Status: DC
  Administered 2011-11-16 – 2011-11-27 (×18): 100 mg via ORAL
  Filled 2011-11-16 (×23): qty 1

## 2011-11-16 MED ORDER — SODIUM CHLORIDE 0.9 % IV BOLUS (SEPSIS)
500.0000 mL | Freq: Once | INTRAVENOUS | Status: AC
  Administered 2011-11-16: 500 mL via INTRAVENOUS

## 2011-11-16 MED ORDER — ONDANSETRON HCL 4 MG PO TABS: 4.0000 mg | ORAL_TABLET | Freq: Four times a day (QID) | ORAL | Status: DC | PRN

## 2011-11-16 MED ORDER — FENTANYL CITRATE 0.05 MG/ML IJ SOLN
INTRAMUSCULAR | Status: AC
  Administered 2011-11-16: 25 ug
  Filled 2011-11-16: qty 2

## 2011-11-16 MED ORDER — IOHEXOL 300 MG/ML  SOLN
100.0000 mL | Freq: Once | INTRAMUSCULAR | Status: AC | PRN
  Administered 2011-11-16: 100 mL via INTRAVENOUS

## 2011-11-16 MED ORDER — GELATIN ABSORBABLE 12-7 MM EX MISC
CUTANEOUS | Status: AC
  Filled 2011-11-16: qty 1

## 2011-11-16 MED ORDER — SODIUM CHLORIDE 0.9 % IV SOLN
INTRAVENOUS | Status: AC | PRN
  Administered 2011-11-16: 50 mL/h via INTRAVENOUS

## 2011-11-16 MED ORDER — SODIUM CHLORIDE 0.9 % IJ SOLN
3.0000 mL | Freq: Two times a day (BID) | INTRAMUSCULAR | Status: DC
  Administered 2011-11-16: 3 mL via INTRAVENOUS

## 2011-11-16 MED ORDER — ALBUTEROL SULFATE (5 MG/ML) 0.5% IN NEBU: 2.5000 mg | INHALATION_SOLUTION | RESPIRATORY_TRACT | Status: DC | PRN

## 2011-11-16 MED ORDER — MORPHINE SULFATE 2 MG/ML IJ SOLN: 1.0000 mg | INTRAMUSCULAR | Status: DC | PRN

## 2011-11-16 MED ORDER — HYDROCODONE-ACETAMINOPHEN 5-325 MG PO TABS
1.0000 | ORAL_TABLET | ORAL | Status: DC | PRN
  Administered 2011-11-18 – 2011-11-25 (×6): 1 via ORAL
  Filled 2011-11-16: qty 2
  Filled 2011-11-16 (×4): qty 1
  Filled 2011-11-16: qty 2
  Filled 2011-11-16: qty 1

## 2011-11-16 NOTE — Procedures (Signed)
Bilateral internal iliac artery anterior branch  gelfoam embolization No complication No blood loss. See complete dictation in [facility].

## 2011-11-16 NOTE — ED Notes (Signed)
Per EMS pt got up from bed sometime during the night and fell. Per EMS pt then tried to crawl back to bed and was found by the caregiver slumped over the bed. Pt lives in [facility] and is ambulating with walker.

## 2011-11-16 NOTE — ED Notes (Signed)
Bedside report received from previous RN 

## 2011-11-16 NOTE — ED Notes (Signed)
Unable to obtain labs at this time due to patient having a procedure.

## 2011-11-16 NOTE — ED Notes (Signed)
Pt sts she got up at night to go to bathroom sts she didn't use her walker and she fell. Pt sts she then crawl back to her bed where she was found by her caregiver. Per pt and caregiver pt is unsteady and this fall was an accident. Pt has no complaints other than abdominal pain, and she sts she needs to urinate. Pt has abrasions on her knees.

## 2011-11-16 NOTE — H&P (Signed)
GWENEVERE Harrison is an 78 y.o. female.    PCP: Cala Bradford, MD   Chief Complaint: Fall  HPI: This is a 78 year old, Caucasian female, who lives in an independent living facility. Patient apparently fell sometime this morning, while she was walking towards the bathroom. The daughter tells me that the patient told her that she did not pass out. It was a mechanical fall. She tried to crawl back into the bed and was found by the caregiver slumped over the bed. There is no history of any recent illness. No fever, chills, nausea, vomiting. Patient denies any chest pain at this time. Denies any shortness of breath. No recent diarrhea. No recent weakness. She does have severe osteoporosis with multiple spinal compression fractures. She uses a wheelchair and sometimes a walker. It is surprising that she lives in independent living facility. Patient complains of pain in both her hip areas and the lower part of the abdomen. The pain is more so, when she is moved around. She is unable to characterize the pain. She's unable to quantify the pain at this time. No obvious issues with urinating.   Home Medications: Prior to Admission medications   Medication Sig Start Date End Date Taking? Authorizing Provider  aspirin EC 81 MG tablet Take 81 mg by mouth daily.   Yes Historical Provider, MD  calcium carbonate (TUMS - DOSED IN MG ELEMENTAL CALCIUM) 500 MG chewable tablet Chew 1 tablet by mouth daily.   Yes Historical Provider, MD  cholecalciferol (VITAMIN D) 1000 UNITS tablet Take 1,000 Units by mouth daily.   Yes Historical Provider, MD  lisinopril (PRINIVIL,ZESTRIL) 10 MG tablet Take 10 mg by mouth daily.   Yes Historical Provider, MD  Multiple Vitamins-Minerals (PRESERVISION/LUTEIN PO) Take 1 capsule by mouth daily.   Yes Historical Provider, MD  raloxifene (EVISTA) 60 MG tablet Take 60 mg by mouth daily.   Yes Historical Provider, MD    Allergies:  Allergies  Allergen Reactions  . Codeine Nausea Only      Past Medical History: Past Medical History  Diagnosis Date  . Osteoporosis   . Compression fracture   . Hypertension     Past Surgical History  Procedure Date  . Femur fracture surgery   . Spinal fusion     Social History:  reports that she has never smoked. She does not have any smokeless tobacco history on file. She reports that she does not drink alcohol or use illicit drugs.  Family History: Unable to tell me of any health problems in the family.  Review of Systems - unobtainable from patient due to medical acuity and mental status  Physical Examination Blood pressure 142/77, pulse 92, temperature 98.5 F (36.9 C), resp. rate 18, SpO2 100.00%.  General appearance: alert, cooperative, appears stated age and no distress Head: Normocephalic, without obvious abnormality, atraumatic Eyes: conjunctivae/corneas clear. PERRL, EOM's intact.  Neck: no adenopathy, no carotid bruit, no JVD, supple, symmetrical, trachea midline and thyroid not enlarged, symmetric, no tenderness/mass/nodules Resp: clear to auscultation bilaterally Cardio: regular rate and rhythm, S1, S2 normal, no murmur, click, rub or gallop GI: soft, minimal tenderness in lower abdomen without rebound rigidity or guarding; bowel sounds normal; no masses,  no organomegaly Extremities: extremities normal, atraumatic, no cyanosis or edema Pulses: 2+ and symmetric Skin: bruising over both knees. No obvious deformity of knees Lymph nodes: Cervical, supraclavicular, and axillary nodes normal. Neurologic: Alert. Hard of hearing at times. No focal deficits.  Laboratory Data: Results for orders placed  during the hospital encounter of 11/16/11 (from the past 48 hour(s))  URINALYSIS, ROUTINE W REFLEX MICROSCOPIC     Status: Normal   Collection Time   11/16/11 10:36 AM      Component Value Range Comment   Color, Urine YELLOW  YELLOW    APPearance CLEAR  CLEAR    Specific Gravity, Urine 1.018  1.005 - 1.030    pH 5.5   5.0 - 8.0    Glucose, UA NEGATIVE  NEGATIVE mg/dL    Hgb urine dipstick NEGATIVE  NEGATIVE    Bilirubin Urine NEGATIVE  NEGATIVE    Ketones, ur NEGATIVE  NEGATIVE mg/dL    Protein, ur NEGATIVE  NEGATIVE mg/dL    Urobilinogen, UA 0.2  0.0 - 1.0 mg/dL    Nitrite NEGATIVE  NEGATIVE    Leukocytes, UA NEGATIVE  NEGATIVE MICROSCOPIC NOT DONE ON URINES WITH NEGATIVE PROTEIN, BLOOD, LEUKOCYTES, NITRITE, OR GLUCOSE <1000 mg/dL.  CBC WITH DIFFERENTIAL     Status: Abnormal   Collection Time   11/16/11 10:59 AM      Component Value Range Comment   WBC 20.5 (*) 4.0 - 10.5 K/uL    RBC 3.60 (*) 3.87 - 5.11 MIL/uL    Hemoglobin 11.1 (*) 12.0 - 15.0 g/dL    HCT 16.1 (*) 09.6 - 46.0 %    MCV 88.1  78.0 - 100.0 fL    MCH 30.8  26.0 - 34.0 pg    MCHC 35.0  30.0 - 36.0 g/dL    RDW 04.5  40.9 - 81.1 %    Platelets 190  150 - 400 K/uL    Neutrophils Relative 87 (*) 43 - 77 %    Neutro Abs 18.0 (*) 1.7 - 7.7 K/uL    Lymphocytes Relative 6 (*) 12 - 46 %    Lymphs Abs 1.2  0.7 - 4.0 K/uL    Monocytes Relative 7  3 - 12 %    Monocytes Absolute 1.4 (*) 0.1 - 1.0 K/uL    Eosinophils Relative 0  0 - 5 %    Eosinophils Absolute 0.0  0.0 - 0.7 K/uL    Basophils Relative 0  0 - 1 %    Basophils Absolute 0.0  0.0 - 0.1 K/uL   BASIC METABOLIC PANEL     Status: Abnormal   Collection Time   11/16/11 10:59 AM      Component Value Range Comment   Sodium 134 (*) 135 - 145 mEq/L    Potassium 3.8  3.5 - 5.1 mEq/L    Chloride 99  96 - 112 mEq/L    CO2 20  19 - 32 mEq/L    Glucose, Bld 151 (*) 70 - 99 mg/dL    BUN 32 (*) 6 - 23 mg/dL    Creatinine, Ser 9.14  0.50 - 1.10 mg/dL    Calcium 9.7  8.4 - 78.2 mg/dL    GFR calc non Af Amer 46 (*) >90 mL/min    GFR calc Af Amer 53 (*) >90 mL/min   TROPONIN I     Status: Normal   Collection Time   11/16/11 10:59 AM      Component Value Range Comment   Troponin I <0.30  <0.30 ng/mL     Radiology Reports: Ct Head Wo Contrast  11/16/2011  *RADIOLOGY REPORT*  Clinical  Data:  78 year old female with headache and neck pain following fall.  CT HEAD WITHOUT CONTRAST CT CERVICAL SPINE WITHOUT CONTRAST  Technique:  Multidetector  CT imaging of the head and cervical spine was performed following the standard protocol without intravenous contrast.  Multiplanar CT image reconstructions of the cervical spine were also generated.  Comparison:  05/22/2005.  CT HEAD  Findings: Atrophy and chronic small vessel white matter ischemic changes are again identified.  No acute intracranial abnormalities are identified, including mass lesion or mass effect, hydrocephalus, extra-axial fluid collection, midline shift, hemorrhage, or acute infarction.  The visualized bony calvarium is unremarkable.  IMPRESSION: No evidence of acute intracranial abnormality.  Atrophy and chronic small vessel white matter ischemic changes.  CT CERVICAL SPINE  Findings: 1.5 mm retrolisthesis of C5 in relation to C4-C6 is identified, almost certainly degenerative. There is no evidence of acute fracture or prevertebral soft tissue swelling. Diffuse osteopenia and moderate facet arthropathy throughout the cervical spine noted. Severe degenerative disc disease at C4-C5 an C5-C6 noted. No focal bony lesions are present. No soft tissue abnormalities are noted.  IMPRESSION: No static evidence of acute injury to the cervical spine.  Mild retrolisthesis of C5 with severe degenerative changes at C4-C5 and C5-C6.  Diffuse osteopenia.   Original Report Authenticated By: Rosendo Gros, M.D.    Ct Cervical Spine Wo Contrast  11/16/2011  *RADIOLOGY REPORT*  Clinical Data:  78 year old female with headache and neck pain following fall.  CT HEAD WITHOUT CONTRAST CT CERVICAL SPINE WITHOUT CONTRAST  Technique:  Multidetector CT imaging of the head and cervical spine was performed following the standard protocol without intravenous contrast.  Multiplanar CT image reconstructions of the cervical spine were also generated.  Comparison:   05/22/2005.  CT HEAD  Findings: Atrophy and chronic small vessel white matter ischemic changes are again identified.  No acute intracranial abnormalities are identified, including mass lesion or mass effect, hydrocephalus, extra-axial fluid collection, midline shift, hemorrhage, or acute infarction.  The visualized bony calvarium is unremarkable.  IMPRESSION: No evidence of acute intracranial abnormality.  Atrophy and chronic small vessel white matter ischemic changes.  CT CERVICAL SPINE  Findings: 1.5 mm retrolisthesis of C5 in relation to C4-C6 is identified, almost certainly degenerative. There is no evidence of acute fracture or prevertebral soft tissue swelling. Diffuse osteopenia and moderate facet arthropathy throughout the cervical spine noted. Severe degenerative disc disease at C4-C5 an C5-C6 noted. No focal bony lesions are present. No soft tissue abnormalities are noted.  IMPRESSION: No static evidence of acute injury to the cervical spine.  Mild retrolisthesis of C5 with severe degenerative changes at C4-C5 and C5-C6.  Diffuse osteopenia.   Original Report Authenticated By: Rosendo Gros, M.D.    Ct Abdomen Pelvis W Contrast  11/16/2011  *RADIOLOGY REPORT*  Clinical Data: Pain post fall.  Possible pelvic fracture.  CT ABDOMEN AND PELVIS WITH CONTRAST  Technique:  Multidetector CT imaging of the abdomen and pelvis was performed following the standard protocol during bolus administration of intravenous contrast.  Contrast: OMNIPAQUE IOHEXOL 300 MG/ML  SOLN  Comparison: None.  Findings: Linear scarring or subsegmental atelectasis posteriorly in the visualized lung bases.  Calcified granuloma in the inferior lingula.  Sub centimeter subcapsular probable cyst in the medial left hepatic segment.  Otherwise unremarkable liver, gallbladder, spleen, adrenal glands, right kidney.  There is a small probable cyst in the upper pole left kidney.  No hydronephrosis.  Stomach and small bowel are nondistended.   Moderate fecal material in the colon. No definite ascites. No free air.  Minimally-displaced fractures of bilateral superior and inferior ischiopubic rami, comminuted and displaced on the  left.  There is a moderately large prevesical, extraperitoneal, left retroperitoneal/presacral and anterior body wall hematoma.  There are at least three focal areas of probable active extravasation noted, two just cephalad to the left superior ischiopubic ramus, the third  posterior to the right superior pubic ramus.  There is a minimally-displaced fracture through the left sacral ala without definite involvement of the sacral foramina.  Fixation hardware across the left femoral neck is partially seen. Progressive vertebral compression fracture deformities of T11, T12, L1, L2, and L4 since previous films of 03/11/2008.  There is mild retropulsion into the spinal canal at T11, T12, L1, and L2.  No displaced posterior element fractures evident.  IMPRESSION:  1.  Acute fractures of superior and inferior ischiopubic rami, with a moderately large pelvic hematoma and evidence of bilateral active extravasation. I telephoned the critical test results to Dr. Fredderick Phenix at the time of interpretation. 2.  Minimally-displaced fracture of the left sacral ala. 3.  Multiple thoracolumbar compression fracture deformities, progressive since 03/11/2008.   Original Report Authenticated By: Osa Craver, M.D.    Dg Abd Acute W/chest  11/16/2011  *RADIOLOGY REPORT*  Clinical Data: Abdominal distention, abdominal pain  ACUTE ABDOMEN SERIES (ABDOMEN 2 VIEW & CHEST 1 VIEW)  Comparison: Abdominal film 03/11/2008  Findings: Mild enlarged cardiac silhouette with ectatic aorta. There is no free air beneath hemidiaphragms.  Lungs are hyperinflated but clear.  The left lateral decubitus view demonstrates no intraperitoneal free air.  No dilated dilated loops of large or small bowel.  There is gas the rectum.  There is a left hip internal fixation.   There is irregularity along the superior pubic rami on the left.  IMPRESSION:  1.  No acute cardiopulmonary findings. 2.  No evidence of bowel obstruction or intraperitoneal free air. 3.  Concern for left pubic rami fracture.  Recommend dedicated views of the pelvis or CT for further evaluation.   Original Report Authenticated By: Genevive Bi, M.D.     Electrocardiogram: Sinus rhythm at 90 beats per minute. The computer has read this as atrial fibrillation. However, the rhythm is regular and the P waves are noted before each QRS complex. No concerning Q waves. No concerning ST or T-wave changes are noted.  Assessment/Plan  Principal Problem:  *Pelvic hematoma, female Active Problems:  Bilateral pubic rami fractures  Anemia due to blood loss, acute  Osteoporosis  HTN (hypertension)  Leukocytosis   #1 pelvic hematoma likely secondary to fall: Patient is heading to interventional radiology for localizing the source of the bleeding and possible embolization. She will be monitored in the step down unit overnight. CBCs will be checked frequently. Transfusions will be provided as needed. She'll be supported hemodynamically while she is in the step down unit.  #2 bilateral pubic rami fractures: This has been discussed with orthopedic surgeon, Dr. Luiz Blare and he will evaluate the patient this evening. She'll be on bedrest for now.  #3 anemia due to acute blood loss: As mentioned above CBCs will be checked frequently. Anemia panel will be checked. She'll be transfused as needed.  #4 leukocytosis: There is no obvious source of infection. UA is clear. Chest x-ray does not show any infiltrates. Elevated white cell count could be indicative of stress demargination. Continue to follow white cell count on a daily basis. If she becomes febrile, further options will be considered.  #5 history of osteoporosis, with multiple compression fractures: We'll resume her calcium and vitamin D as soon as  possible.  #  6 history of hypertension: We will hold her ACE inhibitors for now. Monitor blood pressures closely during the course of the evening and night. Support with IV fluids as needed.  DVT, prophylaxis with SCDs.  CODE STATUS was discussed with the daughter, and the patient and she is a DO NOT RESUSCITATE.  Further management decisions will depend on results of further testing and patient's response to treatment.   [facility]  [facility] Pager [phone]  11/16/2011, 4:31 PM

## 2011-11-16 NOTE — Consult Note (Signed)
Reason for Consult:fracture of superior and inferior pubic ramus bilaterally. Referring Physician: hospitalists  Sharon Harrison is an 78 y.o. female.  HPI: 78 year old female who fell earlier today.  She was seen in emergency room with slight pain in the groin region.  Plain x-ray showed questionable pubic ramus fractures and CT scan was ordered.  Unfortunately CT scan showed pubic ramus fractures was some questionable hematoma and extravasation.  We re consulted for management of the pubic ramus fractures.I had a discussion with the emergency room physician confirming that we would do no operative treatment for the pubic ramus fractures.  She stated that special procedures to radiology wanted to do interventional procedure to address the extravasation.  I suggested that this was not unreasonable but that the orthopedic treatment would be weightbearing as tolerated with the use of a walker.  We will follow the patient in the hospital for her pelvic fracture.  Past Medical History  Diagnosis Date  . Osteoporosis   . Compression fracture   . Hypertension     Past Surgical History  Procedure Date  . Femur fracture surgery   . Spinal fusion     History reviewed. No pertinent family history.  Social History:  reports that she has never smoked. She does not have any smokeless tobacco history on file. She reports that she does not drink alcohol or use illicit drugs.  Allergies:  Allergies  Allergen Reactions  . Codeine Nausea Only    Medications: I have reviewed the patient's current medications.  Results for orders placed during the hospital encounter of 11/16/11 (from the past 48 hour(s))  URINALYSIS, ROUTINE W REFLEX MICROSCOPIC     Status: Normal   Collection Time   11/16/11 10:36 AM      Component Value Range Comment   Color, Urine YELLOW  YELLOW    APPearance CLEAR  CLEAR    Specific Gravity, Urine 1.018  1.005 - 1.030    pH 5.5  5.0 - 8.0    Glucose, UA NEGATIVE  NEGATIVE  mg/dL    Hgb urine dipstick NEGATIVE  NEGATIVE    Bilirubin Urine NEGATIVE  NEGATIVE    Ketones, ur NEGATIVE  NEGATIVE mg/dL    Protein, ur NEGATIVE  NEGATIVE mg/dL    Urobilinogen, UA 0.2  0.0 - 1.0 mg/dL    Nitrite NEGATIVE  NEGATIVE    Leukocytes, UA NEGATIVE  NEGATIVE MICROSCOPIC NOT DONE ON URINES WITH NEGATIVE PROTEIN, BLOOD, LEUKOCYTES, NITRITE, OR GLUCOSE <1000 mg/dL.  CBC WITH DIFFERENTIAL     Status: Abnormal   Collection Time   11/16/11 10:59 AM      Component Value Range Comment   WBC 20.5 (*) 4.0 - 10.5 K/uL    RBC 3.60 (*) 3.87 - 5.11 MIL/uL    Hemoglobin 11.1 (*) 12.0 - 15.0 g/dL    HCT 81.1 (*) 91.4 - 46.0 %    MCV 88.1  78.0 - 100.0 fL    MCH 30.8  26.0 - 34.0 pg    MCHC 35.0  30.0 - 36.0 g/dL    RDW 78.2  95.6 - 21.3 %    Platelets 190  150 - 400 K/uL    Neutrophils Relative 87 (*) 43 - 77 %    Neutro Abs 18.0 (*) 1.7 - 7.7 K/uL    Lymphocytes Relative 6 (*) 12 - 46 %    Lymphs Abs 1.2  0.7 - 4.0 K/uL    Monocytes Relative 7  3 - 12 %  Monocytes Absolute 1.4 (*) 0.1 - 1.0 K/uL    Eosinophils Relative 0  0 - 5 %    Eosinophils Absolute 0.0  0.0 - 0.7 K/uL    Basophils Relative 0  0 - 1 %    Basophils Absolute 0.0  0.0 - 0.1 K/uL   BASIC METABOLIC PANEL     Status: Abnormal   Collection Time   11/16/11 10:59 AM      Component Value Range Comment   Sodium 134 (*) 135 - 145 mEq/L    Potassium 3.8  3.5 - 5.1 mEq/L    Chloride 99  96 - 112 mEq/L    CO2 20  19 - 32 mEq/L    Glucose, Bld 151 (*) 70 - 99 mg/dL    BUN 32 (*) 6 - 23 mg/dL    Creatinine, Ser 1.91  0.50 - 1.10 mg/dL    Calcium 9.7  8.4 - 47.8 mg/dL    GFR calc non Af Amer 46 (*) >90 mL/min    GFR calc Af Amer 53 (*) >90 mL/min   TROPONIN I     Status: Normal   Collection Time   11/16/11 10:59 AM      Component Value Range Comment   Troponin I <0.30  <0.30 ng/mL     Ct Head Wo Contrast  11/16/2011  *RADIOLOGY REPORT*  Clinical Data:  78 year old female with headache and neck pain following  fall.  CT HEAD WITHOUT CONTRAST CT CERVICAL SPINE WITHOUT CONTRAST  Technique:  Multidetector CT imaging of the head and cervical spine was performed following the standard protocol without intravenous contrast.  Multiplanar CT image reconstructions of the cervical spine were also generated.  Comparison:  05/22/2005.  CT HEAD  Findings: Atrophy and chronic small vessel white matter ischemic changes are again identified.  No acute intracranial abnormalities are identified, including mass lesion or mass effect, hydrocephalus, extra-axial fluid collection, midline shift, hemorrhage, or acute infarction.  The visualized bony calvarium is unremarkable.  IMPRESSION: No evidence of acute intracranial abnormality.  Atrophy and chronic small vessel white matter ischemic changes.  CT CERVICAL SPINE  Findings: 1.5 mm retrolisthesis of C5 in relation to C4-C6 is identified, almost certainly degenerative. There is no evidence of acute fracture or prevertebral soft tissue swelling. Diffuse osteopenia and moderate facet arthropathy throughout the cervical spine noted. Severe degenerative disc disease at C4-C5 an C5-C6 noted. No focal bony lesions are present. No soft tissue abnormalities are noted.  IMPRESSION: No static evidence of acute injury to the cervical spine.  Mild retrolisthesis of C5 with severe degenerative changes at C4-C5 and C5-C6.  Diffuse osteopenia.   Original Report Authenticated By: Rosendo Gros, M.D.    Ct Cervical Spine Wo Contrast  11/16/2011  *RADIOLOGY REPORT*  Clinical Data:  78 year old female with headache and neck pain following fall.  CT HEAD WITHOUT CONTRAST CT CERVICAL SPINE WITHOUT CONTRAST  Technique:  Multidetector CT imaging of the head and cervical spine was performed following the standard protocol without intravenous contrast.  Multiplanar CT image reconstructions of the cervical spine were also generated.  Comparison:  05/22/2005.  CT HEAD  Findings: Atrophy and chronic small vessel  white matter ischemic changes are again identified.  No acute intracranial abnormalities are identified, including mass lesion or mass effect, hydrocephalus, extra-axial fluid collection, midline shift, hemorrhage, or acute infarction.  The visualized bony calvarium is unremarkable.  IMPRESSION: No evidence of acute intracranial abnormality.  Atrophy and chronic small vessel white matter ischemic  changes.  CT CERVICAL SPINE  Findings: 1.5 mm retrolisthesis of C5 in relation to C4-C6 is identified, almost certainly degenerative. There is no evidence of acute fracture or prevertebral soft tissue swelling. Diffuse osteopenia and moderate facet arthropathy throughout the cervical spine noted. Severe degenerative disc disease at C4-C5 an C5-C6 noted. No focal bony lesions are present. No soft tissue abnormalities are noted.  IMPRESSION: No static evidence of acute injury to the cervical spine.  Mild retrolisthesis of C5 with severe degenerative changes at C4-C5 and C5-C6.  Diffuse osteopenia.   Original Report Authenticated By: Rosendo Gros, M.D.    Ct Abdomen Pelvis W Contrast  11/16/2011  *RADIOLOGY REPORT*  Clinical Data: Pain post fall.  Possible pelvic fracture.  CT ABDOMEN AND PELVIS WITH CONTRAST  Technique:  Multidetector CT imaging of the abdomen and pelvis was performed following the standard protocol during bolus administration of intravenous contrast.  Contrast: OMNIPAQUE IOHEXOL 300 MG/ML  SOLN  Comparison: None.  Findings: Linear scarring or subsegmental atelectasis posteriorly in the visualized lung bases.  Calcified granuloma in the inferior lingula.  Sub centimeter subcapsular probable cyst in the medial left hepatic segment.  Otherwise unremarkable liver, gallbladder, spleen, adrenal glands, right kidney.  There is a small probable cyst in the upper pole left kidney.  No hydronephrosis.  Stomach and small bowel are nondistended.  Moderate fecal material in the colon. No definite ascites. No  free air.  Minimally-displaced fractures of bilateral superior and inferior ischiopubic rami, comminuted and displaced on the left.  There is a moderately large prevesical, extraperitoneal, left retroperitoneal/presacral and anterior body wall hematoma.  There are at least three focal areas of probable active extravasation noted, two just cephalad to the left superior ischiopubic ramus, the third  posterior to the right superior pubic ramus.  There is a minimally-displaced fracture through the left sacral ala without definite involvement of the sacral foramina.  Fixation hardware across the left femoral neck is partially seen. Progressive vertebral compression fracture deformities of T11, T12, L1, L2, and L4 since previous films of 03/11/2008.  There is mild retropulsion into the spinal canal at T11, T12, L1, and L2.  No displaced posterior element fractures evident.  IMPRESSION:  1.  Acute fractures of superior and inferior ischiopubic rami, with a moderately large pelvic hematoma and evidence of bilateral active extravasation. I telephoned the critical test results to Dr. Fredderick Phenix at the time of interpretation. 2.  Minimally-displaced fracture of the left sacral ala. 3.  Multiple thoracolumbar compression fracture deformities, progressive since 03/11/2008.   Original Report Authenticated By: Osa Craver, M.D.    Dg Abd Acute W/chest  11/16/2011  *RADIOLOGY REPORT*  Clinical Data: Abdominal distention, abdominal pain  ACUTE ABDOMEN SERIES (ABDOMEN 2 VIEW & CHEST 1 VIEW)  Comparison: Abdominal film 03/11/2008  Findings: Mild enlarged cardiac silhouette with ectatic aorta. There is no free air beneath hemidiaphragms.  Lungs are hyperinflated but clear.  The left lateral decubitus view demonstrates no intraperitoneal free air.  No dilated dilated loops of large or small bowel.  There is gas the rectum.  There is a left hip internal fixation.  There is irregularity along the superior pubic rami on the left.   IMPRESSION:  1.  No acute cardiopulmonary findings. 2.  No evidence of bowel obstruction or intraperitoneal free air. 3.  Concern for left pubic rami fracture.  Recommend dedicated views of the pelvis or CT for further evaluation.   Original Report Authenticated By: Genevive Bi,  M.D.     ROS: I have reviewed the patient's review of systems thoroughly and there are no positive responses as relates to the HPI. EXAM Blood pressure 104/79, pulse 92, temperature 98.5 F (36.9 C), resp. rate 18, SpO2 94.00%. Well-developed well-nourished patient in no acute distress. Alert and oriented x3 HEENT:within normal limits Cardiac: Regular rate and rhythm Pulmonary: Lungs clear to auscultation Abdomen: Soft and nontender.  Normal active bowel sounds  Musculoskeletal: mild painful lower extremities with range of motion centered in the groin.     Mild soft tissue swelling anterior in the pelvis.  Good distal pulses.no obvious swelling over the inferior pelvic area.  Tender to palpation in this area.  Assessment/Plan: 78 year old female with bilateral superior and inferior pubic ramus fractures with extravasation and hematoma.//The orthopedic treatment for these fractures will be weightbearing as tolerated ambulation with use of a walker.  I am in concurrence with the treatment plan as outlined by the emergency room physician to engage interventional radiology to address the extravasation around the fractures.  I will follow patient in the hospital.  Curley Hogen L 11/16/2011, 4:49 PM

## 2011-11-16 NOTE — ED Provider Notes (Signed)
History     CSN: [id number]  Arrival date & time 11/16/11  0941   First MD Initiated Contact with Patient 11/16/11 1002      Chief Complaint  Patient presents with  . Fall    (Consider location/radiation/quality/duration/timing/severity/associated sxs/prior treatment) HPI Comments: Pt presents from NH after sustaining a fall.  Pt says that she got up out of bed, going to bathroom and was found in floor.  Her daughter says that she is frail and that it is not unexpected that she would have this fall.  She has not had any recent illnesses.  No fevers, no vomiting.  Per daughter, she has not been any weaker than normal.  Is at baseline mental status.  Pt denies any injuries from fall.  Per EMS, pt was hypotensive in 80s at [facility].  Patient is a 78 y.o. female presenting with fall. The history is provided by the patient.  Fall Pertinent negatives include no fever, no numbness, no abdominal pain, no nausea, no vomiting, no hematuria and no headaches.    History reviewed. No pertinent past medical history.  History reviewed. No pertinent past surgical history.  No family history on file.  History  Substance Use Topics  . Smoking status: Not on file  . Smokeless tobacco: Not on file  . Alcohol Use: Not on file    OB History    Grav Para Term Preterm Abortions TAB SAB Ect Mult Living                  Review of Systems  Constitutional: Negative for fever, chills, diaphoresis and fatigue.  HENT: Negative for congestion, rhinorrhea and sneezing.   Eyes: Negative.   Respiratory: Negative for cough, chest tightness and shortness of breath.   Cardiovascular: Negative for chest pain and leg swelling.  Gastrointestinal: Negative for nausea, vomiting, abdominal pain, diarrhea and blood in stool.  Genitourinary: Negative for frequency, hematuria, flank pain and difficulty urinating.  Musculoskeletal: Negative for back pain and arthralgias.  Skin: Negative for rash.  Neurological:  Negative for dizziness, speech difficulty, weakness, numbness and headaches.    Allergies  Codeine  Home Medications   Current Outpatient Rx  Name Route Sig Dispense Refill  . ASPIRIN EC 81 MG PO TBEC Oral Take 81 mg by mouth daily.    Marland Kitchen CALCIUM CARBONATE ANTACID 500 MG PO CHEW Oral Chew 1 tablet by mouth daily.    Marland Kitchen VITAMIN D 1000 UNITS PO TABS Oral Take 1,000 Units by mouth daily.    Marland Kitchen LISINOPRIL 10 MG PO TABS Oral Take 10 mg by mouth daily.    Marland Kitchen PRESERVISION/LUTEIN PO Oral Take 1 capsule by mouth daily.    Marland Kitchen RALOXIFENE HCL 60 MG PO TABS Oral Take 60 mg by mouth daily.      BP 123/86  Pulse 94  Temp 98.6 F (37 C)  Resp 20  SpO2 94%  Physical Exam  Constitutional: She is oriented to person, place, and time. She appears well-developed and well-nourished.  HENT:  Head: Normocephalic and atraumatic.  Mouth/Throat: Oropharynx is clear and moist.  Eyes: Pupils are equal, round, and reactive to light.  Neck:       TTP mid cervical spine.  No pain to thoracic or LS spine  Cardiovascular: Normal rate, regular rhythm and normal heart sounds.   Pulmonary/Chest: Effort normal and breath sounds normal. No respiratory distress. She has no wheezes. She has no rales. She exhibits no tenderness.  Abdominal: Soft. Bowel sounds are  normal. There is tenderness (moderate diffuse tenderness). There is no rebound and no guarding.  Musculoskeletal: Normal range of motion. She exhibits no edema.  Lymphadenopathy:    She has no cervical adenopathy.  Neurological: She is alert and oriented to person, place, and time. She has normal strength. No cranial nerve deficit or sensory deficit.  Skin: Skin is warm and dry. No rash noted.  Psychiatric: She has a normal mood and affect.    ED Course  Procedures (including critical care time)  Results for orders placed during the hospital encounter of 11/16/11  CBC WITH DIFFERENTIAL      Component Value Range   WBC 20.5 (*) 4.0 - 10.5 K/uL   RBC 3.60  (*) 3.87 - 5.11 MIL/uL   Hemoglobin 11.1 (*) 12.0 - 15.0 g/dL   HCT 13.0 (*) 86.5 - 78.4 %   MCV 88.1  78.0 - 100.0 fL   MCH 30.8  26.0 - 34.0 pg   MCHC 35.0  30.0 - 36.0 g/dL   RDW 69.6  29.5 - 28.4 %   Platelets 190  150 - 400 K/uL   Neutrophils Relative 87 (*) 43 - 77 %   Neutro Abs 18.0 (*) 1.7 - 7.7 K/uL   Lymphocytes Relative 6 (*) 12 - 46 %   Lymphs Abs 1.2  0.7 - 4.0 K/uL   Monocytes Relative 7  3 - 12 %   Monocytes Absolute 1.4 (*) 0.1 - 1.0 K/uL   Eosinophils Relative 0  0 - 5 %   Eosinophils Absolute 0.0  0.0 - 0.7 K/uL   Basophils Relative 0  0 - 1 %   Basophils Absolute 0.0  0.0 - 0.1 K/uL  URINALYSIS, ROUTINE W REFLEX MICROSCOPIC      Component Value Range   Color, Urine YELLOW  YELLOW   APPearance CLEAR  CLEAR   Specific Gravity, Urine 1.018  1.005 - 1.030   pH 5.5  5.0 - 8.0   Glucose, UA NEGATIVE  NEGATIVE mg/dL   Hgb urine dipstick NEGATIVE  NEGATIVE   Bilirubin Urine NEGATIVE  NEGATIVE   Ketones, ur NEGATIVE  NEGATIVE mg/dL   Protein, ur NEGATIVE  NEGATIVE mg/dL   Urobilinogen, UA 0.2  0.0 - 1.0 mg/dL   Nitrite NEGATIVE  NEGATIVE   Leukocytes, UA NEGATIVE  NEGATIVE  BASIC METABOLIC PANEL      Component Value Range   Sodium 134 (*) 135 - 145 mEq/L   Potassium 3.8  3.5 - 5.1 mEq/L   Chloride 99  96 - 112 mEq/L   CO2 20  19 - 32 mEq/L   Glucose, Bld 151 (*) 70 - 99 mg/dL   BUN 32 (*) 6 - 23 mg/dL   Creatinine, Ser 1.32  0.50 - 1.10 mg/dL   Calcium 9.7  8.4 - 44.0 mg/dL   GFR calc non Af Amer 46 (*) >90 mL/min   GFR calc Af Amer 53 (*) >90 mL/min  TROPONIN I      Component Value Range   Troponin I <0.30  <0.30 ng/mL   Ct Head Wo Contrast  11/16/2011  *RADIOLOGY REPORT*  Clinical Data:  78 year old female with headache and neck pain following fall.  CT HEAD WITHOUT CONTRAST CT CERVICAL SPINE WITHOUT CONTRAST  Technique:  Multidetector CT imaging of the head and cervical spine was performed following the standard protocol without intravenous contrast.   Multiplanar CT image reconstructions of the cervical spine were also generated.  Comparison:  05/22/2005.  CT HEAD  Findings: Atrophy and chronic small vessel white matter ischemic changes are again identified.  No acute intracranial abnormalities are identified, including mass lesion or mass effect, hydrocephalus, extra-axial fluid collection, midline shift, hemorrhage, or acute infarction.  The visualized bony calvarium is unremarkable.  IMPRESSION: No evidence of acute intracranial abnormality.  Atrophy and chronic small vessel white matter ischemic changes.  CT CERVICAL SPINE  Findings: 1.5 mm retrolisthesis of C5 in relation to C4-C6 is identified, almost certainly degenerative. There is no evidence of acute fracture or prevertebral soft tissue swelling. Diffuse osteopenia and moderate facet arthropathy throughout the cervical spine noted. Severe degenerative disc disease at C4-C5 an C5-C6 noted. No focal bony lesions are present. No soft tissue abnormalities are noted.  IMPRESSION: No static evidence of acute injury to the cervical spine.  Mild retrolisthesis of C5 with severe degenerative changes at C4-C5 and C5-C6.  Diffuse osteopenia.   Original Report Authenticated By: Rosendo Gros, M.D.    Ct Cervical Spine Wo Contrast  11/16/2011  *RADIOLOGY REPORT*  Clinical Data:  78 year old female with headache and neck pain following fall.  CT HEAD WITHOUT CONTRAST CT CERVICAL SPINE WITHOUT CONTRAST  Technique:  Multidetector CT imaging of the head and cervical spine was performed following the standard protocol without intravenous contrast.  Multiplanar CT image reconstructions of the cervical spine were also generated.  Comparison:  05/22/2005.  CT HEAD  Findings: Atrophy and chronic small vessel white matter ischemic changes are again identified.  No acute intracranial abnormalities are identified, including mass lesion or mass effect, hydrocephalus, extra-axial fluid collection, midline shift, hemorrhage,  or acute infarction.  The visualized bony calvarium is unremarkable.  IMPRESSION: No evidence of acute intracranial abnormality.  Atrophy and chronic small vessel white matter ischemic changes.  CT CERVICAL SPINE  Findings: 1.5 mm retrolisthesis of C5 in relation to C4-C6 is identified, almost certainly degenerative. There is no evidence of acute fracture or prevertebral soft tissue swelling. Diffuse osteopenia and moderate facet arthropathy throughout the cervical spine noted. Severe degenerative disc disease at C4-C5 an C5-C6 noted. No focal bony lesions are present. No soft tissue abnormalities are noted.  IMPRESSION: No static evidence of acute injury to the cervical spine.  Mild retrolisthesis of C5 with severe degenerative changes at C4-C5 and C5-C6.  Diffuse osteopenia.   Original Report Authenticated By: Rosendo Gros, M.D.    Ct Abdomen Pelvis W Contrast  11/16/2011  *RADIOLOGY REPORT*  Clinical Data: Pain post fall.  Possible pelvic fracture.  CT ABDOMEN AND PELVIS WITH CONTRAST  Technique:  Multidetector CT imaging of the abdomen and pelvis was performed following the standard protocol during bolus administration of intravenous contrast.  Contrast: OMNIPAQUE IOHEXOL 300 MG/ML  SOLN  Comparison: None.  Findings: Linear scarring or subsegmental atelectasis posteriorly in the visualized lung bases.  Calcified granuloma in the inferior lingula.  Sub centimeter subcapsular probable cyst in the medial left hepatic segment.  Otherwise unremarkable liver, gallbladder, spleen, adrenal glands, right kidney.  There is a small probable cyst in the upper pole left kidney.  No hydronephrosis.  Stomach and small bowel are nondistended.  Moderate fecal material in the colon. No definite ascites. No free air.  Minimally-displaced fractures of bilateral superior and inferior ischiopubic rami, comminuted and displaced on the left.  There is a moderately large prevesical, extraperitoneal, left  retroperitoneal/presacral and anterior body wall hematoma.  There are at least three focal areas of probable active extravasation noted, two just cephalad to the left superior ischiopubic  ramus, the third  posterior to the right superior pubic ramus.  There is a minimally-displaced fracture through the left sacral ala without definite involvement of the sacral foramina.  Fixation hardware across the left femoral neck is partially seen. Progressive vertebral compression fracture deformities of T11, T12, L1, L2, and L4 since previous films of 03/11/2008.  There is mild retropulsion into the spinal canal at T11, T12, L1, and L2.  No displaced posterior element fractures evident.  IMPRESSION:  1.  Acute fractures of superior and inferior ischiopubic rami, with a moderately large pelvic hematoma and evidence of bilateral active extravasation. I telephoned the critical test results to Dr. Fredderick Phenix at the time of interpretation. 2.  Minimally-displaced fracture of the left sacral ala. 3.  Multiple thoracolumbar compression fracture deformities, progressive since 03/11/2008.   Original Report Authenticated By: Osa Craver, M.D.    Dg Abd Acute W/chest  11/16/2011  *RADIOLOGY REPORT*  Clinical Data: Abdominal distention, abdominal pain  ACUTE ABDOMEN SERIES (ABDOMEN 2 VIEW & CHEST 1 VIEW)  Comparison: Abdominal film 03/11/2008  Findings: Mild enlarged cardiac silhouette with ectatic aorta. There is no free air beneath hemidiaphragms.  Lungs are hyperinflated but clear.  The left lateral decubitus view demonstrates no intraperitoneal free air.  No dilated dilated loops of large or small bowel.  There is gas the rectum.  There is a left hip internal fixation.  There is irregularity along the superior pubic rami on the left.  IMPRESSION:  1.  No acute cardiopulmonary findings. 2.  No evidence of bowel obstruction or intraperitoneal free air. 3.  Concern for left pubic rami fracture.  Recommend dedicated views of the  pelvis or CT for further evaluation.   Original Report Authenticated By: Genevive Bi, M.D.      Date: 11/16/2011  Rate: 98  Rhythm: normal sinus rhythm  QRS Axis: normal  Intervals: normal  ST/T Wave abnormalities: ST elevations inferiorly and ST depressions laterally  Conduction Disutrbances:none  Narrative Interpretation:   Old EKG Reviewed: changes noted    1. Bilateral pubic rami fractures       MDM  Pt with bilateral pubic rami fractures.  Discussed with IR who will attempt embolization.  Discussed with Dr. Luiz Blare with othro who will consult on pt, but is requesting hospitalist to admit.  Will consult hospitalist.  Pt with active bleeding.  Hgb okay now, will type and screen.  BP was initially hypotensive at NH, but no further episodes of hypotension.  CRITICAL CARE Performed by: Nakhia Levitan   Total critical care time: 60  Critical care time was exclusive of separately billable procedures and treating other patients.  Critical care was necessary to treat or prevent imminent or life-threatening deterioration.  Critical care was time spent personally by me on the following activities: development of treatment plan with patient and/or surrogate as well as nursing, discussions with consultants, evaluation of patient's response to treatment, examination of patient, obtaining history from patient or surrogate, ordering and performing treatments and interventions, ordering and review of laboratory studies, ordering and review of radiographic studies, pulse oximetry and re-evaluation of patient's condition.         Rolan Bucco, MD 11/16/11 [id number]

## 2011-11-16 NOTE — ED Notes (Signed)
ZOX:[id number]<AV> Expected date:11/16/11<BR> Expected time: 9:17 AM<BR> Means of arrival:Ambulance<BR> Comments:<BR> fall

## 2011-11-17 DIAGNOSIS — D72829 Elevated white blood cell count, unspecified: Secondary | ICD-10-CM

## 2011-11-17 LAB — CBC
HCT: 24.2 % — ABNORMAL LOW (ref 36.0–46.0)
HCT: 22.8 % — ABNORMAL LOW (ref 36.0–46.0)
Hemoglobin: 8.2 g/dL — ABNORMAL LOW (ref 12.0–15.0)
Hemoglobin: 10 g/dL — ABNORMAL LOW (ref 12.0–15.0)
MCH: 31.1 pg (ref 26.0–34.0)
MCH: 31.5 pg (ref 26.0–34.0)
MCH: 30.2 pg (ref 26.0–34.0)
MCHC: 36 g/dL (ref 30.0–36.0)
MCHC: 36 g/dL (ref 30.0–36.0)
MCHC: 35.8 g/dL (ref 30.0–36.0)
MCV: 86.4 fL (ref 78.0–100.0)
MCV: 87.7 fL (ref 78.0–100.0)
Platelets: 140 10*3/uL — ABNORMAL LOW (ref 150–400)
RBC: 2.6 MIL/uL — ABNORMAL LOW (ref 3.87–5.11)
RBC: 3.31 MIL/uL — ABNORMAL LOW (ref 3.87–5.11)
RDW: 15.9 % — ABNORMAL HIGH (ref 11.5–15.5)

## 2011-11-17 LAB — COMPREHENSIVE METABOLIC PANEL
ALT: 24 U/L (ref 0–35)
BUN: 34 mg/dL — ABNORMAL HIGH (ref 6–23)
CO2: 23 mEq/L (ref 19–32)
Calcium: 8.7 mg/dL (ref 8.4–10.5)
GFR calc Af Amer: 80 mL/min — ABNORMAL LOW (ref >90)
GFR calc non Af Amer: 69 mL/min — ABNORMAL LOW (ref >90)
Glucose, Bld: 145 mg/dL — ABNORMAL HIGH (ref 70–99)
Sodium: 133 mEq/L — ABNORMAL LOW (ref 135–145)
Total Protein: 4.9 g/dL — ABNORMAL LOW (ref 6.0–8.3)

## 2011-11-17 LAB — FOLATE: Folate: 13.3 ng/mL

## 2011-11-17 LAB — IRON AND TIBC
Saturation Ratios: 8 % — ABNORMAL LOW (ref 20–55)
UIBC: 259 ug/dL (ref 125–400)

## 2011-11-17 LAB — PREPARE RBC (CROSSMATCH)

## 2011-11-17 MED ORDER — FUROSEMIDE 10 MG/ML IJ SOLN
20.0000 mg | Freq: Once | INTRAMUSCULAR | Status: AC
  Administered 2011-11-17: 20 mg via INTRAVENOUS
  Filled 2011-11-17: qty 2

## 2011-11-17 MED ORDER — POLYETHYLENE GLYCOL 3350 17 G PO PACK
17.0000 g | PACK | Freq: Every day | ORAL | Status: DC | PRN
  Filled 2011-11-17: qty 1

## 2011-11-17 NOTE — Progress Notes (Addendum)
PCP: Cala Bradford, MD  Brief HPI: This is a 78 year old, Caucasian female, who lives in an independent living facility. Patient apparently fell sometime the morning of admission, while she was walking towards the bathroom. The daughter tells me that the patient told her that she did not pass out. It was a mechanical fall. She tried to crawl back into the bed and was found by the caregiver slumped over the bed. There was no history of any recent illness. No fever, chills, nausea, vomiting. Patient denied any chest pain. Denied any shortness of breath. No recent diarrhea. No recent weakness. She does have severe osteoporosis with multiple spinal compression fractures. She uses a wheelchair and sometimes a walker. It is surprising that she lives in independent living facility. Patient complained of pain in both her hip areas and the lower part of the abdomen. The pain is more so, when she is moved around. She was unable to characterize the pain. She was unable to quantify the pain. No obvious issues with urinating.  Past medical history:  Past Medical History  Diagnosis Date  . Osteoporosis   . Compression fracture   . Hypertension     Consultants: Ortho (Dr. Luiz Blare), IR  Procedures: Bilateral Iliac artery anterior branch embolization  Subjective: Patient feels well. Pain with movement only. No nausea or vomiting.  Objective: Vital signs in last 24 hours: Temp:  [97.9 F (36.6 C)-98.6 F (37 C)] 98 F (36.7 C) (10/28 0400) Pulse Rate:  [81-115] 115  (10/28 0700) Resp:  [17-29] 17  (10/28 0700) BP: (104-144)/(54-95) 130/68 mmHg (10/28 0700) SpO2:  [90 %-100 %] 100 % (10/28 0700) Weight:  [50 kg (110 lb 3.7 oz)] 50 kg (110 lb 3.7 oz) (10/27 2100) Weight change:     Intake/Output from previous day: 10/27 0701 - 10/28 0700 In: 1200 [P.O.:300; I.V.:900] Out: 775 [Urine:775] Intake/Output this shift:    General appearance: alert, cooperative, appears stated age and no  distress Resp: clear to auscultation bilaterally Cardio: regular rate and rhythm, S1, S2 normal, no murmur, click, rub or gallop GI: soft, tender in lower abdomen with rebound rigidity or guarding; bowel sounds normal; no masses,  no organomegaly Extremities: extremities normal, atraumatic, no cyanosis or edema. Pain with movement of either LE Pulses: 2+ and symmetric Skin: Skin color, texture, turgor normal. No rashes or lesions Neurologic: Alert. No focal deficits.  Lab Results:  [facility] 11/17/11 0350 11/16/11 2155  WBC 16.9* 16.9*  HGB 8.7* 9.4*  HCT 24.2* 26.5*  PLT 146* 148*   BMET  Basename 11/17/11 0350 11/16/11 1059  NA 133* 134*  K 4.5 3.8  CL 101 99  CO2 23 20  GLUCOSE 145* 151*  BUN 34* 32*  CREATININE 0.79 1.02  CALCIUM 8.7 9.7  ALT 24 --    Studies/Results: Ct Head Wo Contrast  11/16/2011  *RADIOLOGY REPORT*  Clinical Data:  78 year old female with headache and neck pain following fall.  CT HEAD WITHOUT CONTRAST CT CERVICAL SPINE WITHOUT CONTRAST  Technique:  Multidetector CT imaging of the head and cervical spine was performed following the standard protocol without intravenous contrast.  Multiplanar CT image reconstructions of the cervical spine were also generated.  Comparison:  05/22/2005.  CT HEAD  Findings: Atrophy and chronic small vessel white matter ischemic changes are again identified.  No acute intracranial abnormalities are identified, including mass lesion or mass effect, hydrocephalus, extra-axial fluid collection, midline shift, hemorrhage, or acute infarction.  The visualized bony calvarium is unremarkable.  IMPRESSION:  No evidence of acute intracranial abnormality.  Atrophy and chronic small vessel white matter ischemic changes.  CT CERVICAL SPINE  Findings: 1.5 mm retrolisthesis of C5 in relation to C4-C6 is identified, almost certainly degenerative. There is no evidence of acute fracture or prevertebral soft tissue swelling. Diffuse osteopenia and  moderate facet arthropathy throughout the cervical spine noted. Severe degenerative disc disease at C4-C5 an C5-C6 noted. No focal bony lesions are present. No soft tissue abnormalities are noted.  IMPRESSION: No static evidence of acute injury to the cervical spine.  Mild retrolisthesis of C5 with severe degenerative changes at C4-C5 and C5-C6.  Diffuse osteopenia.   Original Report Authenticated By: Rosendo Gros, M.D.    Ct Cervical Spine Wo Contrast  11/16/2011  *RADIOLOGY REPORT*  Clinical Data:  78 year old female with headache and neck pain following fall.  CT HEAD WITHOUT CONTRAST CT CERVICAL SPINE WITHOUT CONTRAST  Technique:  Multidetector CT imaging of the head and cervical spine was performed following the standard protocol without intravenous contrast.  Multiplanar CT image reconstructions of the cervical spine were also generated.  Comparison:  05/22/2005.  CT HEAD  Findings: Atrophy and chronic small vessel white matter ischemic changes are again identified.  No acute intracranial abnormalities are identified, including mass lesion or mass effect, hydrocephalus, extra-axial fluid collection, midline shift, hemorrhage, or acute infarction.  The visualized bony calvarium is unremarkable.  IMPRESSION: No evidence of acute intracranial abnormality.  Atrophy and chronic small vessel white matter ischemic changes.  CT CERVICAL SPINE  Findings: 1.5 mm retrolisthesis of C5 in relation to C4-C6 is identified, almost certainly degenerative. There is no evidence of acute fracture or prevertebral soft tissue swelling. Diffuse osteopenia and moderate facet arthropathy throughout the cervical spine noted. Severe degenerative disc disease at C4-C5 an C5-C6 noted. No focal bony lesions are present. No soft tissue abnormalities are noted.  IMPRESSION: No static evidence of acute injury to the cervical spine.  Mild retrolisthesis of C5 with severe degenerative changes at C4-C5 and C5-C6.  Diffuse osteopenia.    Original Report Authenticated By: Rosendo Gros, M.D.    Ct Abdomen Pelvis W Contrast  11/16/2011  *RADIOLOGY REPORT*  Clinical Data: Pain post fall.  Possible pelvic fracture.  CT ABDOMEN AND PELVIS WITH CONTRAST  Technique:  Multidetector CT imaging of the abdomen and pelvis was performed following the standard protocol during bolus administration of intravenous contrast.  Contrast: OMNIPAQUE IOHEXOL 300 MG/ML  SOLN  Comparison: None.  Findings: Linear scarring or subsegmental atelectasis posteriorly in the visualized lung bases.  Calcified granuloma in the inferior lingula.  Sub centimeter subcapsular probable cyst in the medial left hepatic segment.  Otherwise unremarkable liver, gallbladder, spleen, adrenal glands, right kidney.  There is a small probable cyst in the upper pole left kidney.  No hydronephrosis.  Stomach and small bowel are nondistended.  Moderate fecal material in the colon. No definite ascites. No free air.  Minimally-displaced fractures of bilateral superior and inferior ischiopubic rami, comminuted and displaced on the left.  There is a moderately large prevesical, extraperitoneal, left retroperitoneal/presacral and anterior body wall hematoma.  There are at least three focal areas of probable active extravasation noted, two just cephalad to the left superior ischiopubic ramus, the third  posterior to the right superior pubic ramus.  There is a minimally-displaced fracture through the left sacral ala without definite involvement of the sacral foramina.  Fixation hardware across the left femoral neck is partially seen. Progressive vertebral compression fracture deformities  of T11, T12, L1, L2, and L4 since previous films of 03/11/2008.  There is mild retropulsion into the spinal canal at T11, T12, L1, and L2.  No displaced posterior element fractures evident.  IMPRESSION:  1.  Acute fractures of superior and inferior ischiopubic rami, with a moderately large pelvic hematoma and  evidence of bilateral active extravasation. I telephoned the critical test results to Dr. Fredderick Phenix at the time of interpretation. 2.  Minimally-displaced fracture of the left sacral ala. 3.  Multiple thoracolumbar compression fracture deformities, progressive since 03/11/2008.   Original Report Authenticated By: Osa Craver, M.D.    Ir Angiogram Follow Up Study  11/16/2011  Dayne Oley Balm III, MD     11/16/2011  6:12 PM Bilateral internal iliac artery anterior branch  gelfoam  embolization No complication No blood loss. See complete dictation in [facility].    Dg Abd Acute W/chest  11/16/2011  *RADIOLOGY REPORT*  Clinical Data: Abdominal distention, abdominal pain  ACUTE ABDOMEN SERIES (ABDOMEN 2 VIEW & CHEST 1 VIEW)  Comparison: Abdominal film 03/11/2008  Findings: Mild enlarged cardiac silhouette with ectatic aorta. There is no free air beneath hemidiaphragms.  Lungs are hyperinflated but clear.  The left lateral decubitus view demonstrates no intraperitoneal free air.  No dilated dilated loops of large or small bowel.  There is gas the rectum.  There is a left hip internal fixation.  There is irregularity along the superior pubic rami on the left.  IMPRESSION:  1.  No acute cardiopulmonary findings. 2.  No evidence of bowel obstruction or intraperitoneal free air. 3.  Concern for left pubic rami fracture.  Recommend dedicated views of the pelvis or CT for further evaluation.   Original Report Authenticated By: Genevive Bi, M.D.     Medications:  Scheduled:   . docusate sodium  100 mg Oral BID  . fentaNYL      . gelatin adsorbable      . lidocaine      . midazolam      . sodium chloride  500 mL Intravenous Once  . sodium chloride  3 mL Intravenous Q12H   Continuous:   . sodium chloride 1,000 mL (11/17/11 0545)  . sodium chloride 50 mL/hr (11/16/11 1635)   NWG:NFAOZH chloride, acetaminophen, acetaminophen, albuterol, HYDROcodone-acetaminophen, iohexol, morphine  injection, ondansetron (ZOFRAN) IV, ondansetron  Assessment/Plan:  Principal Problem:  *Pelvic hematoma, female Active Problems:  Bilateral pubic rami fractures  Anemia due to blood loss, acute  Osteoporosis  HTN (hypertension)  Leukocytosis    Pelvic hematoma likely secondary to fall Patient is status post iliac vessel embolization bilaterally. Is tender in the lower abdomen presumably due to the hematoma. Continue to monitor. She is hemodynamically stable. Hgb has dropped some.   Anemia due to acute blood loss Hgb has dropped. Will transfuse 1 unit of PRBC. Continue to monitor. Anemia panel reviewed.    Bilateral pubic rami fractures Appreciate Ortho input. PT/OT. OOB as tolerated.   Leukocytosis Afebrile. There is no obvious source of infection. UA is clear. Chest x-ray does not show any infiltrates. Elevated white cell count could be indicative of stress demargination. Continue to follow white cell count on a daily basis. If she becomes febrile, further options will be considered.   History of osteoporosis, with multiple compression fractures We'll resume her calcium and vitamin D on discharge  History of hypertension Holding her ACE inhibitors for now. BP stable so far. Support with IV fluids as needed.   DVT prophylaxis  With SCDs.   CODE STATUS Was discussed with the daughter, and the patient and she is a DO NOT RESUSCITATE.   Family Information Daughter to come in later today.  Disposition Will likely need SNF at discharge. Will involve CSW once PT/OT has seen. Ok for transfer to floor.   LOS: 1 day   [facility]  [facility] Pager [phone] 11/17/2011, 7:29 AM

## 2011-11-17 NOTE — Progress Notes (Signed)
   CARE MANAGEMENT NOTE 11/17/2011  Patient:  Sharon Harrison, Sharon Harrison   Account Number:  [id number]  Date Initiated:  11/17/2011  Documentation initiated by:  Jiles Crocker  Subjective/Objective Assessment:   ADMITTED WITH Pelvic hematoma,  Bilateral pubic rami fractures   Anemia due to blood loss, acute     Action/Plan:   PCP: Cala Bradford, MD  PATIENT RESIDES IN AN INDEPENDENT LIVING FACILITY; SOC WORKER REFERRAL PLACED; POSSIBLY SNF PLACEMENT   Anticipated DC Date:  11/24/2011   Anticipated DC Plan:  SKILLED NURSING FACILITY  In-house referral  Clinical Social Worker      DC Planning Services  CM consult            Status of service:  In process, will continue to follow Medicare Important Message given?  NA - LOS <3 / Initial given by admissions (If response is "NO", the following Medicare IM given date fields will be blank) Per UR Regulation:  Reviewed for med. necessity/level of care/duration of stay Comments:  11/17/2011- B Laurieann Friddle RN, BSN, MHA

## 2011-11-17 NOTE — Progress Notes (Signed)
Subjective: Better today   Objective: Vital signs in last 24 hours: Temp:  [97.9 F (36.6 C)-98.6 F (37 C)] 98 F (36.7 C) (10/28 0400) Pulse Rate:  [81-115] 97  (10/28 0730) Resp:  [17-29] 26  (10/28 0730) BP: (104-144)/(54-95) 132/72 mmHg (10/28 0730) SpO2:  [90 %-100 %] 100 % (10/28 0730) Weight:  [50 kg (110 lb 3.7 oz)] 50 kg (110 lb 3.7 oz) (10/27 2100)  Intake/Output from previous day: 10/27 0701 - 10/28 0700 In: 1300 [P.O.:300; I.V.:1000] Out: 775 [Urine:775] Intake/Output this shift: Total I/O In: -  Out: 75 [Urine:75]   Basename 11/17/11 0350 11/16/11 2155 11/16/11 1059  HGB 8.7* 9.4* 11.1*    Basename 11/17/11 0350 11/16/11 2155  WBC 16.9* 16.9*  RBC 2.80* 3.03*  HCT 24.2* 26.5*  PLT 146* 148*    Basename 11/17/11 0350 11/16/11 1059  NA 133* 134*  K 4.5 3.8  CL 101 99  CO2 23 20  BUN 34* 32*  CREATININE 0.79 1.02  GLUCOSE 145* 151*  CALCIUM 8.7 9.7    Basename 11/16/11 1955  LABPT --  INR 1.16    min pain with ROM of lower ext.  Assessment/Plan: bilat sup and inf pubic rami fractures/// may be oob to chair and wbat with walker when cleared by special proceedures   Sharon Harrison L 11/17/2011, 8:06 AM

## 2011-11-17 NOTE — Evaluation (Signed)
Physical Therapy Evaluation Patient Details Name: Sharon Harrison MRN: [id number] DOB: 1914/12/09 Today's Date: 11/17/2011 Time: 4098-1191 PT Time Calculation (min): 22 min  PT Assessment / Plan / Recommendation Clinical Impression  78 yo female with bilater pubic fx who is limited by pain.  She has preexisting posttural deformity with arthitic changes in arms and hands.    PT Assessment  Patient needs continued PT services    Follow Up Recommendations  Post acute inpatient    Does the patient have the potential to tolerate intense rehabilitation   Yes, Recommend IP Rehab Screening  Barriers to Discharge Decreased caregiver support      Equipment Recommendations  None recommended by PT    Recommendations for Other Services     Frequency Min 3X/week    Precautions / Restrictions Precautions Precautions: Fall Restrictions Weight Bearing Restrictions: Yes RLE Weight Bearing: Weight bearing as tolerated LLE Weight Bearing: Weight bearing as tolerated   Pertinent Vitals/Pain Pt c/o sever pain in pelvic area with any movement      Mobility  Bed Mobility Bed Mobility: Supine to Sit;Sit to Supine Supine to Sit: 1: +2 Total assist Supine to Sit: Patient Percentage: 0% Sit to Supine: 1: +2 Total assist Sit to Supine: Patient Percentage: 0% Details for Bed Mobility Assistance: pt c/o pain with any mobility.  needs  +2 assist to come to EOB by pulling on pad  Transfers Transfers: Sit to Stand;Stand to Sit Sit to Stand: 1: +2 Total assist;From elevated surface Sit to Stand: Patient Percentage: 0% Stand to Sit: 1: +2 Total assist Stand to Sit: Patient Percentage: 0% Details for Transfer Assistance: Attempted to lift patient into standing. She was unable to bring weight forward onto feet and feet were sliding. Pt unable to stand due to pain Ambulation/Gait Ambulation/Gait Assistance: Not tested (comment) Assistive device: Rolling walker;None Gait Pattern: Wide base of  support General Gait Details: pt unable to bear weight on feet Stairs: No Wheelchair Mobility Wheelchair Mobility: No    Shoulder Instructions     Exercises     PT Diagnosis: Difficulty walking;Abnormality of gait;Generalized weakness;Acute pain  PT Problem List: Decreased strength;Pain;Decreased activity tolerance;Decreased range of motion;Decreased balance;Decreased mobility;Decreased safety awareness PT Treatment Interventions: DME instruction;Gait training;Functional mobility training;Therapeutic activities;Therapeutic exercise;Balance training;Patient/family education   PT Goals Acute Rehab PT Goals PT Goal Formulation: With patient/family Time For Goal Achievement: 12/01/11 Potential to Achieve Goals: Fair Pt will go Supine/Side to Sit: with min assist PT Goal: Supine/Side to Sit - Progress: Goal set today Pt will go Sit to Supine/Side: with min assist PT Goal: Sit to Supine/Side - Progress: Goal set today Pt will go Sit to Stand: with min assist PT Goal: Sit to Stand - Progress: Goal set today Pt will go Stand to Sit: with min assist PT Goal: Stand to Sit - Progress: Goal set today Pt will Ambulate: 1 - 15 feet;with mod assist;with least restrictive assistive device PT Goal: Ambulate - Progress: Goal set today  Visit Information  Last PT Received On: 11/17/11 Reason Eval/Treat Not Completed: Pain limiting ability to participate    Subjective Data  Subjective: ooooooo... That hurts! (moving in bed) Patient Stated Goal: daughter and patient agree to going to SNF for rehab.  Pt had been at [facility] several years ago   Prior Functioning  Home Living Lives With: Alone Available Help at Discharge: Family Type of Home: Apartment Home Access: Level entry Home Layout: One level Prior Function Level of Independence: Independent with assistive device(s) Comments:  pt had short distance walking in apt with hand hold on furniture and also with  RW Communication Communication: No difficulties    Cognition  Overall Cognitive Status: Appears within functional limits for tasks assessed/performed Arousal/Alertness: Awake/alert Orientation Level: Appears intact for tasks assessed Behavior During Session: [facility] for tasks performed    Extremity/Trunk Assessment Right Upper Extremity Assessment RUE ROM/Strength/Tone: Deficits RUE ROM/Strength/Tone Deficits: muscle wasting. arthritic changes Left Upper Extremity Assessment LUE ROM/Strength/Tone: Deficits LUE ROM/Strength/Tone Deficits: muscle wasting , arthritic changes Right Lower Extremity Assessment RLE ROM/Strength/Tone: Unable to fully assess;Due to pain RLE ROM/Strength/Tone Deficits: pain with attempted hip flexion Left Lower Extremity Assessment LLE ROM/Strength/Tone: Unable to fully assess;Due to pain Trunk Assessment Trunk Assessment: Kyphotic (severely kyphotic/scoliotic dtr sites multiple compression f)   Balance Balance Balance Assessed: Yes Static Sitting Balance Static Sitting - Balance Support: Bilateral upper extremity supported Static Sitting - Level of Assistance: 4: Min assist Static Sitting - Comment/# of Minutes: 3 severe postural abnormalitty  End of Session PT - End of Session Activity Tolerance: Patient limited by pain Patient left: in bed Nurse Communication: Mobility status  GP     Rosey Bath K. Manson Passey, [place] [phone] 11/17/2011, 4:12 PM

## 2011-11-18 LAB — BASIC METABOLIC PANEL
CO2: 25 mEq/L (ref 19–32)
Chloride: 97 mEq/L (ref 96–112)
GFR calc Af Amer: 88 mL/min — ABNORMAL LOW (ref >90)
Potassium: 3.3 mEq/L — ABNORMAL LOW (ref 3.5–5.1)

## 2011-11-18 LAB — CBC
Platelets: 109 10*3/uL — ABNORMAL LOW (ref 150–400)
RBC: 2.56 MIL/uL — ABNORMAL LOW (ref 3.87–5.11)
RDW: 16.8 % — ABNORMAL HIGH (ref 11.5–15.5)
WBC: 11.4 10*3/uL — ABNORMAL HIGH (ref 4.0–10.5)

## 2011-11-18 LAB — PREPARE RBC (CROSSMATCH)

## 2011-11-18 MED ORDER — FUROSEMIDE 10 MG/ML IJ SOLN
40.0000 mg | Freq: Once | INTRAMUSCULAR | Status: AC
  Administered 2011-11-18: 40 mg via INTRAVENOUS
  Filled 2011-11-18: qty 4

## 2011-11-18 NOTE — Progress Notes (Signed)
Clinical Social Work Department CLINICAL SOCIAL WORK PLACEMENT NOTE 11/18/2011  Patient:  Sharon Harrison, Sharon Harrison  Account Number:  [id number] Admit date:  11/16/2011  Clinical Social Worker:  Orpah Greek  Date/time:  11/18/2011 11:48 AM  Clinical Social Work is seeking post-discharge placement for this patient at the following level of care:   SKILLED NURSING   (*CSW will update this form in Epic as items are completed)   11/18/2011  Patient/family provided with [facility] Department of Clinical Social Work's list of facilities offering this level of care within the geographic area requested by the patient (or if unable, by the patient's family).  11/18/2011  Patient/family informed of their freedom to choose among providers that offer the needed level of care, that participate in Medicare, Medicaid or managed care program needed by the patient, have an available bed and are willing to accept the patient.  11/18/2011  Patient/family informed of [facility]' ownership interest in [facility], as well as of the fact that they are under no obligation to receive care at this facility.  PASARR submitted to EDS on 11/18/2011 PASARR number received from EDS on 11/18/2011  FL2 transmitted to all facilities in geographic area requested by pt/family on  11/18/2011 FL2 transmitted to all facilities within larger geographic area on   Patient informed that his/her managed care company has contracts with or will negotiate with  certain facilities, including the following:     Patient/family informed of bed offers received:   Patient chooses bed at  Physician recommends and patient chooses bed at    Patient to be transferred to  on   Patient to be transferred to facility by   The following physician request were entered in Epic:   Additional Comments:  Unice Bailey, LCSW [facility] Clinical Social Worker cell #: [phone]

## 2011-11-18 NOTE — Progress Notes (Signed)
Clinical Social Work Department BRIEF PSYCHOSOCIAL ASSESSMENT 11/18/2011  Patient:  Sharon Harrison, Sharon Harrison     Account Number:  [id number]     Admit date:  11/16/2011  Clinical Social Worker:  Orpah Greek  Date/Time:  11/18/2011 11:39 AM  Referred by:  Physician  Date Referred:  11/18/2011 Referred for  Other - See comment   Other Referral:   Admitted from: [facility]   Interview type:  Patient Other interview type:   and daughter, Raynelle Fanning    PSYCHOSOCIAL DATA Living Status:  FACILITY Admitted from facility:  [facility] Level of care:  Independent Living Primary support name:  Farhana Fellows (daughter) c#: [phone] Primary support relationship to patient:  CHILD, ADULT Degree of support available:   good    CURRENT CONCERNS Current Concerns  Post-Acute Placement   Other Concerns:    SOCIAL WORK ASSESSMENT / PLAN CSW spoke with patient & her daughter, Raynelle Fanning re: discharge planning. Patient was admitted from [facility], though per PT recommendation - patient will require SNF/Rehab stay at discharge before returning to [facility].   Assessment/plan status:  Information/Referral to [facility] Other assessment/ plan:   Information/referral to community resources:   CSW completed FL2 and faxed information out to [facility].    PATIENT'S/FAMILY'S RESPONSE TO PLAN OF CARE: Patient's daughter states that patient had been to [facility] about 5-6 years ago and would prefer that she go there at discharge. CSW spoke with Janie @ [facility] who states that currently they have no beds available, but are anticipating beds opening up later this week. CSW will check back and follow-up with back-up facilities should [facility] not work out.        Unice Bailey, LCSW [facility] Clinical Social Worker cell #: [phone]

## 2011-11-18 NOTE — Progress Notes (Signed)
Dr. Rito Ehrlich notified of elevated b/p 164/78 after 1st unit blood. Order to continue with 2nd unit obtained.

## 2011-11-18 NOTE — Progress Notes (Signed)
Thank you for consult on Sharon Harrison.  She is a 78 year old female who was admitted on 11/16/11 with bilateral pubic rami fractures with extravasation and hematoma.  She has significant postural deformity with UE OA.Patient was unable to participate in PT evaluation yesterday.  She would not be able to participate in CIR program  Would recommend SNF level therapies past discharge. Will defer consult for now.

## 2011-11-18 NOTE — Progress Notes (Signed)
PCP: Cala Bradford, MD  Brief HPI: This is a 78 year old, Caucasian female, who lives in an independent living facility. Patient apparently fell sometime the morning of admission, while she was walking towards the bathroom. The daughter tells me that the patient told her that she did not pass out. It was a mechanical fall. She tried to crawl back into the bed and was found by the caregiver slumped over the bed. There was no history of any recent illness. No fever, chills, nausea, vomiting. Patient denied any chest pain. Denied any shortness of breath. No recent diarrhea. No recent weakness. She does have severe osteoporosis with multiple spinal compression fractures. She uses a wheelchair and sometimes a walker. It is surprising that she lives in independent living facility. Patient complained of pain in both her hip areas and the lower part of the abdomen. The pain is more so, when she is moved around. She was unable to characterize the pain. She was unable to quantify the pain. No obvious issues with urinating.  Past medical history:  Past Medical History  Diagnosis Date  . Osteoporosis   . Compression fracture   . Hypertension     Consultants: Ortho (Dr. Luiz Blare), IR  Procedures: Bilateral Iliac artery anterior branch embolization 10/27  Subjective: Patient feels fine. Denies any pain. No shortness of breath. No nausea or vomiting.  Objective: Vital signs in last 24 hours: Temp:  [97.2 F (36.2 C)-99 F (37.2 C)] 99 F (37.2 C) (10/29 0625) Pulse Rate:  [81-99] 81  (10/29 0625) Resp:  [20-24] 20  (10/29 0625) BP: (115-130)/(57-66) 123/62 mmHg (10/29 0625) SpO2:  [95 %-99 %] 95 % (10/29 0625) Weight change:  Last BM Date: 11/17/11  Intake/Output from previous day: 10/28 0701 - 10/29 0700 In: 1635 [P.O.:1160; I.V.:75; Blood:50] Out: 1325 [Urine:1325] Intake/Output this shift:    General appearance: alert, cooperative, appears stated age and no distress Resp: Decreased  air entry at bases. No crackles. Cardio: regular rate and rhythm, S1, S2 normal, no murmur, click, rub or gallop GI: soft, tender in lower abdomen with rebound rigidity or guarding; bowel sounds normal; no masses,  no organomegaly Extremities: extremities normal, atraumatic, no cyanosis or edema. Pain with movement of either LE Pulses: 2+ and symmetric Neurologic: Alert. No focal deficits.  Lab Results:  Basename 11/18/11 0444 11/17/11 1349  WBC 11.4* 16.6*  HGB 7.9* 10.0*  HCT 21.5* 27.9*  PLT 109* 140*   BMET  Basename 11/18/11 0444 11/17/11 0350  NA 128* 133*  K 3.3* 4.5  CL 97 101  CO2 25 23  GLUCOSE 111* 145*  BUN 21 34*  CREATININE 0.59 0.79  CALCIUM 7.9* 8.7  ALT -- 24    Studies/Results: Ct Head Wo Contrast  11/16/2011  *RADIOLOGY REPORT*  Clinical Data:  78 year old female with headache and neck pain following fall.  CT HEAD WITHOUT CONTRAST CT CERVICAL SPINE WITHOUT CONTRAST  Technique:  Multidetector CT imaging of the head and cervical spine was performed following the standard protocol without intravenous contrast.  Multiplanar CT image reconstructions of the cervical spine were also generated.  Comparison:  05/22/2005.  CT HEAD  Findings: Atrophy and chronic small vessel white matter ischemic changes are again identified.  No acute intracranial abnormalities are identified, including mass lesion or mass effect, hydrocephalus, extra-axial fluid collection, midline shift, hemorrhage, or acute infarction.  The visualized bony calvarium is unremarkable.  IMPRESSION: No evidence of acute intracranial abnormality.  Atrophy and chronic small vessel white matter ischemic changes.  CT CERVICAL SPINE  Findings: 1.5 mm retrolisthesis of C5 in relation to C4-C6 is identified, almost certainly degenerative. There is no evidence of acute fracture or prevertebral soft tissue swelling. Diffuse osteopenia and moderate facet arthropathy throughout the cervical spine noted. Severe  degenerative disc disease at C4-C5 an C5-C6 noted. No focal bony lesions are present. No soft tissue abnormalities are noted.  IMPRESSION: No static evidence of acute injury to the cervical spine.  Mild retrolisthesis of C5 with severe degenerative changes at C4-C5 and C5-C6.  Diffuse osteopenia.   Original Report Authenticated By: Rosendo Gros, M.D.    Ct Cervical Spine Wo Contrast  11/16/2011  *RADIOLOGY REPORT*  Clinical Data:  78 year old female with headache and neck pain following fall.  CT HEAD WITHOUT CONTRAST CT CERVICAL SPINE WITHOUT CONTRAST  Technique:  Multidetector CT imaging of the head and cervical spine was performed following the standard protocol without intravenous contrast.  Multiplanar CT image reconstructions of the cervical spine were also generated.  Comparison:  05/22/2005.  CT HEAD  Findings: Atrophy and chronic small vessel white matter ischemic changes are again identified.  No acute intracranial abnormalities are identified, including mass lesion or mass effect, hydrocephalus, extra-axial fluid collection, midline shift, hemorrhage, or acute infarction.  The visualized bony calvarium is unremarkable.  IMPRESSION: No evidence of acute intracranial abnormality.  Atrophy and chronic small vessel white matter ischemic changes.  CT CERVICAL SPINE  Findings: 1.5 mm retrolisthesis of C5 in relation to C4-C6 is identified, almost certainly degenerative. There is no evidence of acute fracture or prevertebral soft tissue swelling. Diffuse osteopenia and moderate facet arthropathy throughout the cervical spine noted. Severe degenerative disc disease at C4-C5 an C5-C6 noted. No focal bony lesions are present. No soft tissue abnormalities are noted.  IMPRESSION: No static evidence of acute injury to the cervical spine.  Mild retrolisthesis of C5 with severe degenerative changes at C4-C5 and C5-C6.  Diffuse osteopenia.   Original Report Authenticated By: Rosendo Gros, M.D.    Ct Abdomen  Pelvis W Contrast  11/16/2011  *RADIOLOGY REPORT*  Clinical Data: Pain post fall.  Possible pelvic fracture.  CT ABDOMEN AND PELVIS WITH CONTRAST  Technique:  Multidetector CT imaging of the abdomen and pelvis was performed following the standard protocol during bolus administration of intravenous contrast.  Contrast: OMNIPAQUE IOHEXOL 300 MG/ML  SOLN  Comparison: None.  Findings: Linear scarring or subsegmental atelectasis posteriorly in the visualized lung bases.  Calcified granuloma in the inferior lingula.  Sub centimeter subcapsular probable cyst in the medial left hepatic segment.  Otherwise unremarkable liver, gallbladder, spleen, adrenal glands, right kidney.  There is a small probable cyst in the upper pole left kidney.  No hydronephrosis.  Stomach and small bowel are nondistended.  Moderate fecal material in the colon. No definite ascites. No free air.  Minimally-displaced fractures of bilateral superior and inferior ischiopubic rami, comminuted and displaced on the left.  There is a moderately large prevesical, extraperitoneal, left retroperitoneal/presacral and anterior body wall hematoma.  There are at least three focal areas of probable active extravasation noted, two just cephalad to the left superior ischiopubic ramus, the third  posterior to the right superior pubic ramus.  There is a minimally-displaced fracture through the left sacral ala without definite involvement of the sacral foramina.  Fixation hardware across the left femoral neck is partially seen. Progressive vertebral compression fracture deformities of T11, T12, L1, L2, and L4 since previous films of 03/11/2008.  There is mild retropulsion  into the spinal canal at T11, T12, L1, and L2.  No displaced posterior element fractures evident.  IMPRESSION:  1.  Acute fractures of superior and inferior ischiopubic rami, with a moderately large pelvic hematoma and evidence of bilateral active extravasation. I telephoned the critical test  results to Dr. Fredderick Phenix at the time of interpretation. 2.  Minimally-displaced fracture of the left sacral ala. 3.  Multiple thoracolumbar compression fracture deformities, progressive since 03/11/2008.   Original Report Authenticated By: Osa Craver, M.D.    Ir Angiogram Follow Up Study  11/16/2011  Dayne Oley Balm III, MD     11/16/2011  6:12 PM Bilateral internal iliac artery anterior branch  gelfoam  embolization No complication No blood loss. See complete dictation in [facility].    Dg Abd Acute W/chest  11/16/2011  *RADIOLOGY REPORT*  Clinical Data: Abdominal distention, abdominal pain  ACUTE ABDOMEN SERIES (ABDOMEN 2 VIEW & CHEST 1 VIEW)  Comparison: Abdominal film 03/11/2008  Findings: Mild enlarged cardiac silhouette with ectatic aorta. There is no free air beneath hemidiaphragms.  Lungs are hyperinflated but clear.  The left lateral decubitus view demonstrates no intraperitoneal free air.  No dilated dilated loops of large or small bowel.  There is gas the rectum.  There is a left hip internal fixation.  There is irregularity along the superior pubic rami on the left.  IMPRESSION:  1.  No acute cardiopulmonary findings. 2.  No evidence of bowel obstruction or intraperitoneal free air. 3.  Concern for left pubic rami fracture.  Recommend dedicated views of the pelvis or CT for further evaluation.   Original Report Authenticated By: Genevive Bi, M.D.     Medications:  Scheduled:    . docusate sodium  100 mg Oral BID   Continuous:  GEX:[id number], acetaminophen, albuterol, HYDROcodone-acetaminophen, morphine injection, ondansetron (ZOFRAN) IV, ondansetron, polyethylene glycol  Assessment/Plan:  Principal Problem:  *Pelvic hematoma, female Active Problems:  Bilateral pubic rami fractures  Anemia due to blood loss, acute  Osteoporosis  HTN (hypertension)  Leukocytosis    Pelvic hematoma likely secondary to fall Patient is status post iliac vessel  embolization bilaterally. Less tender in the lower abdomen today. Continue to monitor. She is hemodynamically stable.   Anemia due to acute blood loss Hgb has dropped. Will transfuse 2 units of PRBC with Lasix in between. Anemia panel reviewed.    Bilateral pubic rami fractures Appreciate Ortho input. PT/OT. OOB as tolerated.   Leukocytosis Counts have improved. Afebrile. There is no obvious source of infection. UA is clear. Chest x-ray does not show any infiltrates. Elevated white cell count could be indicative of stress demargination. Continue to follow white cell count on a daily basis. If she becomes febrile, further options will be considered.   History of osteoporosis, with multiple compression fractures We'll resume her calcium and vitamin D on discharge  History of hypertension Holding her ACE inhibitors for now. BP stable so far. Support with IV fluids as needed.   DVT prophylaxis With SCDs.   CODE STATUS Was discussed with the daughter, and the patient and she is a DO NOT RESUSCITATE.   Family Information Daughter at bedside.   Disposition Will likely need SNF at discharge. Will consult CIR. Anticipate discharge before end of week.   LOS: 2 days   [facility]  [facility] Pager [phone] 11/18/2011, 10:01 AM

## 2011-11-18 NOTE — Progress Notes (Signed)
OT Note:  Pt was getting blood earlier.  Will check back tomorrow.  [place], OTR/L [phone] 11/18/2011

## 2011-11-19 ENCOUNTER — Encounter (HOSPITAL_COMMUNITY): Payer: Self-pay | Admitting: Internal Medicine

## 2011-11-19 DIAGNOSIS — I272 Pulmonary hypertension, unspecified: Secondary | ICD-10-CM | POA: Insufficient documentation

## 2011-11-19 DIAGNOSIS — I340 Nonrheumatic mitral (valve) insufficiency: Secondary | ICD-10-CM | POA: Insufficient documentation

## 2011-11-19 DIAGNOSIS — I2789 Other specified pulmonary heart diseases: Secondary | ICD-10-CM

## 2011-11-19 DIAGNOSIS — E871 Hypo-osmolality and hyponatremia: Secondary | ICD-10-CM

## 2011-11-19 DIAGNOSIS — E876 Hypokalemia: Secondary | ICD-10-CM

## 2011-11-19 LAB — BASIC METABOLIC PANEL
BUN: 12 mg/dL (ref 6–23)
CO2: 26 mEq/L (ref 19–32)
CO2: 24 mEq/L (ref 19–32)
Calcium: 8.4 mg/dL (ref 8.4–10.5)
Creatinine, Ser: 0.44 mg/dL — ABNORMAL LOW (ref 0.50–1.10)
Glucose, Bld: 112 mg/dL — ABNORMAL HIGH (ref 70–99)
Glucose, Bld: 155 mg/dL — ABNORMAL HIGH (ref 70–99)
Potassium: 2.5 mEq/L — CL (ref 3.5–5.1)
Sodium: 124 mEq/L — ABNORMAL LOW (ref 135–145)
Sodium: 124 mEq/L — ABNORMAL LOW (ref 135–145)

## 2011-11-19 LAB — TYPE AND SCREEN
ABO/RH(D): O NEG
Unit division: 0
Unit division: 0

## 2011-11-19 LAB — CBC
HCT: 29.9 % — ABNORMAL LOW (ref 36.0–46.0)
HCT: 30.7 % — ABNORMAL LOW (ref 36.0–46.0)
Hemoglobin: 10.8 g/dL — ABNORMAL LOW (ref 12.0–15.0)
Hemoglobin: 10.9 g/dL — ABNORMAL LOW (ref 12.0–15.0)
MCH: 30.5 pg (ref 26.0–34.0)
MCV: 84.5 fL (ref 78.0–100.0)
RBC: 3.54 MIL/uL — ABNORMAL LOW (ref 3.87–5.11)
RBC: 3.61 MIL/uL — ABNORMAL LOW (ref 3.87–5.11)

## 2011-11-19 MED ORDER — MAGNESIUM SULFATE 40 MG/ML IJ SOLN
2.0000 g | Freq: Once | INTRAMUSCULAR | Status: AC
  Administered 2011-11-19: 2 g via INTRAVENOUS
  Filled 2011-11-19: qty 50

## 2011-11-19 MED ORDER — SODIUM CHLORIDE 0.9 % IV SOLN
INTRAVENOUS | Status: DC
  Administered 2011-11-19 – 2011-11-26 (×6): via INTRAVENOUS
  Filled 2011-11-19 (×10): qty 1000

## 2011-11-19 MED ORDER — POTASSIUM CHLORIDE 10 MEQ/100ML IV SOLN
10.0000 meq | INTRAVENOUS | Status: AC
  Administered 2011-11-19 (×3): 10 meq via INTRAVENOUS
  Filled 2011-11-19 (×3): qty 100

## 2011-11-19 NOTE — Progress Notes (Signed)
Dr. Ashley Royalty notified of K 2.5 this am.

## 2011-11-19 NOTE — Progress Notes (Signed)
Subjective: Pain with mobilization   Objective: Vital signs in last 24 hours: Temp:  [97.6 F (36.4 C)-99.1 F (37.3 C)] 98.4 F (36.9 C) (10/30 0539) Pulse Rate:  [83-105] 90  (10/30 0539) Resp:  [14-18] 18  (10/30 0539) BP: (138-183)/(69-102) 156/84 mmHg (10/30 0539) SpO2:  [95 %-96 %] 95 % (10/30 0539)  Intake/Output from previous day: 10/29 0701 - 10/30 0700 In: 1475 [P.O.:600; I.V.:250; Blood:625] Out: 1700 [Urine:1700] Intake/Output this shift: Total I/O In: 808.8 [P.O.:370; I.V.:138.8; IV Piggyback:300] Out: 200 [Urine:200]   Basename 11/19/11 0507 11/19/11 0005 11/18/11 0444 11/17/11 1349 11/17/11 0925  HGB 10.9* 10.8* 7.9* 10.0* 8.2*    Basename 11/19/11 0507 11/19/11 0005  WBC 9.6 11.0*  RBC 3.61* 3.54*  HCT 30.7* 29.9*  PLT 115* 111*    Basename 11/19/11 0507 11/18/11 0444  NA 124* 128*  K 2.5* 3.3*  CL 90* 97  CO2 26 25  BUN 11 21  CREATININE 0.44* 0.59  GLUCOSE 112* 111*  CALCIUM 8.2* 7.9*    Basename 11/16/11 1955  LABPT --  INR 1.16    Neurologically intact ABD soft Neurovascular intact Sensation intact distally Intact pulses distally No cellulitis present Compartment soft  Assessment/Plan: Pelvic fractures// continue to mobalize wt bearing as tolerated.  F/u Kiley Solimine 10 days   Smith Mcnicholas L 11/19/2011, 2:26 PM

## 2011-11-19 NOTE — Progress Notes (Signed)
Dr. Ashley Royalty notified of conversion to A Fib w/ RVR, verified by ekg.

## 2011-11-19 NOTE — Progress Notes (Signed)
Subjective: PT states that she feels poorly but is unable to articulate symptoms  any further.   Interval history: Pt converted to A-fib this morning with HR presently in 90's . Per Dr. Cliffton Asters, pt has no prior history of A-fib but does have a history of Pulmonary HTN and MR and TR.   Objective: Filed Vitals:   11/18/11 2026 11/18/11 2133 11/18/11 2155 11/19/11 0539  BP: 150/79 183/102 150/88 156/84  Pulse: 96 85 88 90  Temp: 98.5 F (36.9 C) 98.2 F (36.8 C)  98.4 F (36.9 C)  TempSrc: Oral Oral  Oral  Resp: 16 16  18   Height:      Weight:      SpO2:  96%  95%   Weight change:   Intake/Output Summary (Last 24 hours) at 11/19/11 1116 Last data filed at 11/19/11 0900  Gross per 24 hour  Intake   1475 ml  Output   1900 ml  Net   -425 ml    General: Alert, awake, oriented x3, in no acute distress. Chronically ill appearing. HEENT: [place]/AT [place], EOMI Neck: Trachea midline,  no masses, no thyromegal,y no JVD, no carotid bruit OROPHARYNX:  Moist, No exudate/ erythema/lesions.  Heart: Regular rate and rhythm, without murmurs, rubs, gallops, PMI non-displaced, no heaves or thrills on palpation.  Lungs: Clear to auscultation, no wheezing or rhonchi noted. No increased vocal fremitus resonant to percussion  Abdomen: Soft, nontender, nondistended, positive bowel sounds, no masses no hepatosplenomegaly noted..  Neuro: No focal neurological deficits noted.  Musculoskeletal: No warm swelling or erythema around joints, no spinal tenderness noted.    Lab Results:  Basename 11/19/11 0507 11/18/11 0444  NA 124* 128*  K 2.5* 3.3*  CL 90* 97  CO2 26 25  GLUCOSE 112* 111*  BUN 11 21  CREATININE 0.44* 0.59  CALCIUM 8.2* 7.9*  MG -- --  PHOS -- --    Basename 11/17/11 0350  AST 52*  ALT 24  ALKPHOS 40  BILITOT 0.4  PROT 4.9*  ALBUMIN 2.6*   No results found for this basename: LIPASE:2,AMYLASE:2 in the last 72 hours  Basename 11/19/11 0507 11/19/11 0005  WBC 9.6 11.0*    NEUTROABS -- --  HGB 10.9* 10.8*  HCT 30.7* 29.9*  MCV 85.0 84.5  PLT 115* 111*   No results found for this basename: CKTOTAL:3,CKMB:3,CKMBINDEX:3,TROPONINI:3 in the last 72 hours No components found with this basename: POCBNP:3 No results found for this basename: DDIMER:2 in the last 72 hours No results found for this basename: HGBA1C:2 in the last 72 hours No results found for this basename: CHOL:2,HDL:2,LDLCALC:2,TRIG:2,CHOLHDL:2,LDLDIRECT:2 in the last 72 hours No results found for this basename: TSH,T4TOTAL,FREET3,T3FREE,THYROIDAB in the last 72 hours  Basename 11/16/11 2155  VITAMINB12 575  FOLATE 13.3  FERRITIN 219  TIBC 281  IRON 22*  RETICCTPCT 2.0    Micro Results: Recent Results (from the past 240 hour(s))  MRSA PCR SCREENING     Status: Normal   Collection Time   11/16/11  9:29 PM      Component Value Range Status Comment   MRSA by PCR NEGATIVE  NEGATIVE Final     Studies/Results: Ct Head Wo Contrast  11/16/2011  *RADIOLOGY REPORT*  Clinical Data:  78 year old female with headache and neck pain following fall.  CT HEAD WITHOUT CONTRAST CT CERVICAL SPINE WITHOUT CONTRAST  Technique:  Multidetector CT imaging of the head and cervical spine was performed following the standard protocol without intravenous contrast.  Multiplanar CT image  reconstructions of the cervical spine were also generated.  Comparison:  05/22/2005.  CT HEAD  Findings: Atrophy and chronic small vessel white matter ischemic changes are again identified.  No acute intracranial abnormalities are identified, including mass lesion or mass effect, hydrocephalus, extra-axial fluid collection, midline shift, hemorrhage, or acute infarction.  The visualized bony calvarium is unremarkable.  IMPRESSION: No evidence of acute intracranial abnormality.  Atrophy and chronic small vessel white matter ischemic changes.  CT CERVICAL SPINE  Findings: 1.5 mm retrolisthesis of C5 in relation to C4-C6 is identified, almost  certainly degenerative. There is no evidence of acute fracture or prevertebral soft tissue swelling. Diffuse osteopenia and moderate facet arthropathy throughout the cervical spine noted. Severe degenerative disc disease at C4-C5 an C5-C6 noted. No focal bony lesions are present. No soft tissue abnormalities are noted.  IMPRESSION: No static evidence of acute injury to the cervical spine.  Mild retrolisthesis of C5 with severe degenerative changes at C4-C5 and C5-C6.  Diffuse osteopenia.   Original Report Authenticated By: Rosendo Gros, M.D.    Ct Cervical Spine Wo Contrast  11/16/2011  *RADIOLOGY REPORT*  Clinical Data:  78 year old female with headache and neck pain following fall.  CT HEAD WITHOUT CONTRAST CT CERVICAL SPINE WITHOUT CONTRAST  Technique:  Multidetector CT imaging of the head and cervical spine was performed following the standard protocol without intravenous contrast.  Multiplanar CT image reconstructions of the cervical spine were also generated.  Comparison:  05/22/2005.  CT HEAD  Findings: Atrophy and chronic small vessel white matter ischemic changes are again identified.  No acute intracranial abnormalities are identified, including mass lesion or mass effect, hydrocephalus, extra-axial fluid collection, midline shift, hemorrhage, or acute infarction.  The visualized bony calvarium is unremarkable.  IMPRESSION: No evidence of acute intracranial abnormality.  Atrophy and chronic small vessel white matter ischemic changes.  CT CERVICAL SPINE  Findings: 1.5 mm retrolisthesis of C5 in relation to C4-C6 is identified, almost certainly degenerative. There is no evidence of acute fracture or prevertebral soft tissue swelling. Diffuse osteopenia and moderate facet arthropathy throughout the cervical spine noted. Severe degenerative disc disease at C4-C5 an C5-C6 noted. No focal bony lesions are present. No soft tissue abnormalities are noted.  IMPRESSION: No static evidence of acute injury to the  cervical spine.  Mild retrolisthesis of C5 with severe degenerative changes at C4-C5 and C5-C6.  Diffuse osteopenia.   Original Report Authenticated By: Rosendo Gros, M.D.    Ct Abdomen Pelvis W Contrast  11/16/2011  *RADIOLOGY REPORT*  Clinical Data: Pain post fall.  Possible pelvic fracture.  CT ABDOMEN AND PELVIS WITH CONTRAST  Technique:  Multidetector CT imaging of the abdomen and pelvis was performed following the standard protocol during bolus administration of intravenous contrast.  Contrast: OMNIPAQUE IOHEXOL 300 MG/ML  SOLN  Comparison: None.  Findings: Linear scarring or subsegmental atelectasis posteriorly in the visualized lung bases.  Calcified granuloma in the inferior lingula.  Sub centimeter subcapsular probable cyst in the medial left hepatic segment.  Otherwise unremarkable liver, gallbladder, spleen, adrenal glands, right kidney.  There is a small probable cyst in the upper pole left kidney.  No hydronephrosis.  Stomach and small bowel are nondistended.  Moderate fecal material in the colon. No definite ascites. No free air.  Minimally-displaced fractures of bilateral superior and inferior ischiopubic rami, comminuted and displaced on the left.  There is a moderately large prevesical, extraperitoneal, left retroperitoneal/presacral and anterior body wall hematoma.  There are at least  three focal areas of probable active extravasation noted, two just cephalad to the left superior ischiopubic ramus, the third  posterior to the right superior pubic ramus.  There is a minimally-displaced fracture through the left sacral ala without definite involvement of the sacral foramina.  Fixation hardware across the left femoral neck is partially seen. Progressive vertebral compression fracture deformities of T11, T12, L1, L2, and L4 since previous films of 03/11/2008.  There is mild retropulsion into the spinal canal at T11, T12, L1, and L2.  No displaced posterior element fractures evident.   IMPRESSION:  1.  Acute fractures of superior and inferior ischiopubic rami, with a moderately large pelvic hematoma and evidence of bilateral active extravasation. I telephoned the critical test results to Dr. Fredderick Phenix at the time of interpretation. 2.  Minimally-displaced fracture of the left sacral ala. 3.  Multiple thoracolumbar compression fracture deformities, progressive since 03/11/2008.   Original Report Authenticated By: Osa Craver, M.D.    Ir Angiogram Pelvis Selective Or Supraselective  11/18/2011  *RADIOLOGY REPORT*  Clinical Data: 78 year old female with multiple pelvic fractures, pelvic hematoma, evidence of active bilateral extravasation in the anterior pelvis on CT.  She is currently hemodynamically stable.  PELVIC SELECTIVE ARTERIOGRAPHY,IR ULTRASOUND GUIDANCE VASC ACCESS RIGHT,TRANSCATHETER THERAPY EMBOLIZATION,ARTERIOGRAPHY,ADDITIONAL ARTERIOGRAPHY  Comparison: None.  Approach:  Right common femoral artery  Vessels catheterized:  Anterior branch left internal iliac artery (third order), anterior branch right internal iliac artery (second order)  Technique and findings: The procedure, risks (including but not limited to bleeding, infection, organ damage), benefits, and alternatives were explained to the patient and daughter.  Questions regarding the procedure were encouraged and answered.  The patient understands and consents to the procedure.The right femoral region was prepped and draped usual sterile fashion. Maximal barrier sterile technique was utilized including caps, mask, sterile gowns, sterile gloves, sterile drape, hand hygiene and skin antiseptic.  Intravenous Fentanyl and Versed were administered as conscious sedation during continuous cardiorespiratory monitoring by the radiology RN, with a total moderate sedation time of 120 minutes.  Under real time ultrasound guidance, the right common femoral artery was accessed with a 21-gauge micropuncture needle.  This was exchanged  over a 018 guide wire for a transitional dilator, which allowed placement of a Benson wire into the abdominal aorta.  Over this, a 6-French vascular sheath was placed.  Through this, a 5- [place] C2 catheter was advanced in attempts to traverse the aortic bifurcation but because of the relatively acute angulation tortuosity, this was unsuccessful.  Aortography at the level of the bifurcation demonstrates mild tortuosity of the distal abdominal aorta   and common iliac arteries without significant atheromatous irregularity, dissection, or stenosis.  The C2 was exchanged for a Sos catheter, used to place a guide wire across the aortic bifurcation.  Over this, the C2 catheter was advanced.  After selective left internal iliac arteriography in multiple projections, an anterior branch of the internal iliac system supplying the region of active extravasation seen on CT was identified and selectively catheterized with the C2 catheter.  Gelfoam slurry embolization was performed.  Follow-up left internal iliac arteriography demonstrates  cessation of flow into the treated segment, no evident complication.  No active extravasation.  In similar fashion, the C2 catheter was directed into   the right internal iliac artery. Selective right pelvic arteriography in multiple projections was obtained.  An anterior branch supplying the region of active extravasation seen on CT was identified. The branch was selectively catheterized and embolization  performed  with a Gelfoam slurry.  Follow-up   right internal iliac arteriography demonstrates cessation of flow in the treated segment.  No evidence of active extravasation or other apparent complication.  The catheter was then removed.  After confirmatory right femoral arteriography, the sheath was removed and hemostasis achieved with the Exoseal device. The patient tolerated the procedure well and remained hemodynamically stable.  No immediate complication.  IMPRESSION: 1.  Technically  successful Gelfoam embolization of the anterior division left internal iliac artery. 2.  Technically successful Gelfoam embolization of the anterior vision right internal iliac artery.   Original Report Authenticated By: Osa Craver, M.D.    Ir Angiogram Pelvis Selective Or Supraselective  11/18/2011  *RADIOLOGY REPORT*  Clinical Data: 78 year old female with multiple pelvic fractures, pelvic hematoma, evidence of active bilateral extravasation in the anterior pelvis on CT.  She is currently hemodynamically stable.  PELVIC SELECTIVE ARTERIOGRAPHY,IR ULTRASOUND GUIDANCE VASC ACCESS RIGHT,TRANSCATHETER THERAPY EMBOLIZATION,ARTERIOGRAPHY,ADDITIONAL ARTERIOGRAPHY  Comparison: None.  Approach:  Right common femoral artery  Vessels catheterized:  Anterior branch left internal iliac artery (third order), anterior branch right internal iliac artery (second order)  Technique and findings: The procedure, risks (including but not limited to bleeding, infection, organ damage), benefits, and alternatives were explained to the patient and daughter.  Questions regarding the procedure were encouraged and answered.  The patient understands and consents to the procedure.The right femoral region was prepped and draped usual sterile fashion. Maximal barrier sterile technique was utilized including caps, mask, sterile gowns, sterile gloves, sterile drape, hand hygiene and skin antiseptic.  Intravenous Fentanyl and Versed were administered as conscious sedation during continuous cardiorespiratory monitoring by the radiology RN, with a total moderate sedation time of 120 minutes.  Under real time ultrasound guidance, the right common femoral artery was accessed with a 21-gauge micropuncture needle.  This was exchanged over a 018 guide wire for a transitional dilator, which allowed placement of a Benson wire into the abdominal aorta.  Over this, a 6-French vascular sheath was placed.  Through this, a 5- [place] C2 catheter was  advanced in attempts to traverse the aortic bifurcation but because of the relatively acute angulation tortuosity, this was unsuccessful.  Aortography at the level of the bifurcation demonstrates mild tortuosity of the distal abdominal aorta   and common iliac arteries without significant atheromatous irregularity, dissection, or stenosis.  The C2 was exchanged for a Sos catheter, used to place a guide wire across the aortic bifurcation.  Over this, the C2 catheter was advanced.  After selective left internal iliac arteriography in multiple projections, an anterior branch of the internal iliac system supplying the region of active extravasation seen on CT was identified and selectively catheterized with the C2 catheter.  Gelfoam slurry embolization was performed.  Follow-up left internal iliac arteriography demonstrates  cessation of flow into the treated segment, no evident complication.  No active extravasation.  In similar fashion, the C2 catheter was directed into   the right internal iliac artery. Selective right pelvic arteriography in multiple projections was obtained.  An anterior branch supplying the region of active extravasation seen on CT was identified. The branch was selectively catheterized and embolization  performed with a Gelfoam slurry.  Follow-up   right internal iliac arteriography demonstrates cessation of flow in the treated segment.  No evidence of active extravasation or other apparent complication.  The catheter was then removed.  After confirmatory right femoral arteriography, the sheath was removed and hemostasis achieved with the Exoseal device. The patient tolerated  the procedure well and remained hemodynamically stable.  No immediate complication.  IMPRESSION: 1.  Technically successful Gelfoam embolization of the anterior division left internal iliac artery. 2.  Technically successful Gelfoam embolization of the anterior vision right internal iliac artery.   Original Report  Authenticated By: Osa Craver, M.D.    Ir Angiogram Selective Each Additional Vessel  11/18/2011  *RADIOLOGY REPORT*  Clinical Data: 78 year old female with multiple pelvic fractures, pelvic hematoma, evidence of active bilateral extravasation in the anterior pelvis on CT.  She is currently hemodynamically stable.  PELVIC SELECTIVE ARTERIOGRAPHY,IR ULTRASOUND GUIDANCE VASC ACCESS RIGHT,TRANSCATHETER THERAPY EMBOLIZATION,ARTERIOGRAPHY,ADDITIONAL ARTERIOGRAPHY  Comparison: None.  Approach:  Right common femoral artery  Vessels catheterized:  Anterior branch left internal iliac artery (third order), anterior branch right internal iliac artery (second order)  Technique and findings: The procedure, risks (including but not limited to bleeding, infection, organ damage), benefits, and alternatives were explained to the patient and daughter.  Questions regarding the procedure were encouraged and answered.  The patient understands and consents to the procedure.The right femoral region was prepped and draped usual sterile fashion. Maximal barrier sterile technique was utilized including caps, mask, sterile gowns, sterile gloves, sterile drape, hand hygiene and skin antiseptic.  Intravenous Fentanyl and Versed were administered as conscious sedation during continuous cardiorespiratory monitoring by the radiology RN, with a total moderate sedation time of 120 minutes.  Under real time ultrasound guidance, the right common femoral artery was accessed with a 21-gauge micropuncture needle.  This was exchanged over a 018 guide wire for a transitional dilator, which allowed placement of a Benson wire into the abdominal aorta.  Over this, a 6-French vascular sheath was placed.  Through this, a 5- [place] C2 catheter was advanced in attempts to traverse the aortic bifurcation but because of the relatively acute angulation tortuosity, this was unsuccessful.  Aortography at the level of the bifurcation demonstrates mild  tortuosity of the distal abdominal aorta   and common iliac arteries without significant atheromatous irregularity, dissection, or stenosis.  The C2 was exchanged for a Sos catheter, used to place a guide wire across the aortic bifurcation.  Over this, the C2 catheter was advanced.  After selective left internal iliac arteriography in multiple projections, an anterior branch of the internal iliac system supplying the region of active extravasation seen on CT was identified and selectively catheterized with the C2 catheter.  Gelfoam slurry embolization was performed.  Follow-up left internal iliac arteriography demonstrates  cessation of flow into the treated segment, no evident complication.  No active extravasation.  In similar fashion, the C2 catheter was directed into   the right internal iliac artery. Selective right pelvic arteriography in multiple projections was obtained.  An anterior branch supplying the region of active extravasation seen on CT was identified. The branch was selectively catheterized and embolization  performed with a Gelfoam slurry.  Follow-up   right internal iliac arteriography demonstrates cessation of flow in the treated segment.  No evidence of active extravasation or other apparent complication.  The catheter was then removed.  After confirmatory right femoral arteriography, the sheath was removed and hemostasis achieved with the Exoseal device. The patient tolerated the procedure well and remained hemodynamically stable.  No immediate complication.  IMPRESSION: 1.  Technically successful Gelfoam embolization of the anterior division left internal iliac artery. 2.  Technically successful Gelfoam embolization of the anterior vision right internal iliac artery.   Original Report Authenticated By: Osa Craver, M.D.    Ir  Angiogram Selective Each Additional Vessel  11/18/2011  *RADIOLOGY REPORT*  Clinical Data: 78 year old female with multiple pelvic fractures, pelvic  hematoma, evidence of active bilateral extravasation in the anterior pelvis on CT.  She is currently hemodynamically stable.  PELVIC SELECTIVE ARTERIOGRAPHY,IR ULTRASOUND GUIDANCE VASC ACCESS RIGHT,TRANSCATHETER THERAPY EMBOLIZATION,ARTERIOGRAPHY,ADDITIONAL ARTERIOGRAPHY  Comparison: None.  Approach:  Right common femoral artery  Vessels catheterized:  Anterior branch left internal iliac artery (third order), anterior branch right internal iliac artery (second order)  Technique and findings: The procedure, risks (including but not limited to bleeding, infection, organ damage), benefits, and alternatives were explained to the patient and daughter.  Questions regarding the procedure were encouraged and answered.  The patient understands and consents to the procedure.The right femoral region was prepped and draped usual sterile fashion. Maximal barrier sterile technique was utilized including caps, mask, sterile gowns, sterile gloves, sterile drape, hand hygiene and skin antiseptic.  Intravenous Fentanyl and Versed were administered as conscious sedation during continuous cardiorespiratory monitoring by the radiology RN, with a total moderate sedation time of 120 minutes.  Under real time ultrasound guidance, the right common femoral artery was accessed with a 21-gauge micropuncture needle.  This was exchanged over a 018 guide wire for a transitional dilator, which allowed placement of a Benson wire into the abdominal aorta.  Over this, a 6-French vascular sheath was placed.  Through this, a 5- [place] C2 catheter was advanced in attempts to traverse the aortic bifurcation but because of the relatively acute angulation tortuosity, this was unsuccessful.  Aortography at the level of the bifurcation demonstrates mild tortuosity of the distal abdominal aorta   and common iliac arteries without significant atheromatous irregularity, dissection, or stenosis.  The C2 was exchanged for a Sos catheter, used to place a guide  wire across the aortic bifurcation.  Over this, the C2 catheter was advanced.  After selective left internal iliac arteriography in multiple projections, an anterior branch of the internal iliac system supplying the region of active extravasation seen on CT was identified and selectively catheterized with the C2 catheter.  Gelfoam slurry embolization was performed.  Follow-up left internal iliac arteriography demonstrates  cessation of flow into the treated segment, no evident complication.  No active extravasation.  In similar fashion, the C2 catheter was directed into   the right internal iliac artery. Selective right pelvic arteriography in multiple projections was obtained.  An anterior branch supplying the region of active extravasation seen on CT was identified. The branch was selectively catheterized and embolization  performed with a Gelfoam slurry.  Follow-up   right internal iliac arteriography demonstrates cessation of flow in the treated segment.  No evidence of active extravasation or other apparent complication.  The catheter was then removed.  After confirmatory right femoral arteriography, the sheath was removed and hemostasis achieved with the Exoseal device. The patient tolerated the procedure well and remained hemodynamically stable.  No immediate complication.  IMPRESSION: 1.  Technically successful Gelfoam embolization of the anterior division left internal iliac artery. 2.  Technically successful Gelfoam embolization of the anterior vision right internal iliac artery.   Original Report Authenticated By: Osa Craver, M.D.    Ir Transcath/emboliz  11/18/2011  *RADIOLOGY REPORT*  Clinical Data: 78 year old female with multiple pelvic fractures, pelvic hematoma, evidence of active bilateral extravasation in the anterior pelvis on CT.  She is currently hemodynamically stable.  PELVIC SELECTIVE ARTERIOGRAPHY,IR ULTRASOUND GUIDANCE VASC ACCESS RIGHT,TRANSCATHETER THERAPY  EMBOLIZATION,ARTERIOGRAPHY,ADDITIONAL ARTERIOGRAPHY  Comparison: None.  Approach:  Right common femoral  artery  Vessels catheterized:  Anterior branch left internal iliac artery (third order), anterior branch right internal iliac artery (second order)  Technique and findings: The procedure, risks (including but not limited to bleeding, infection, organ damage), benefits, and alternatives were explained to the patient and daughter.  Questions regarding the procedure were encouraged and answered.  The patient understands and consents to the procedure.The right femoral region was prepped and draped usual sterile fashion. Maximal barrier sterile technique was utilized including caps, mask, sterile gowns, sterile gloves, sterile drape, hand hygiene and skin antiseptic.  Intravenous Fentanyl and Versed were administered as conscious sedation during continuous cardiorespiratory monitoring by the radiology RN, with a total moderate sedation time of 120 minutes.  Under real time ultrasound guidance, the right common femoral artery was accessed with a 21-gauge micropuncture needle.  This was exchanged over a 018 guide wire for a transitional dilator, which allowed placement of a Benson wire into the abdominal aorta.  Over this, a 6-French vascular sheath was placed.  Through this, a 5- [place] C2 catheter was advanced in attempts to traverse the aortic bifurcation but because of the relatively acute angulation tortuosity, this was unsuccessful.  Aortography at the level of the bifurcation demonstrates mild tortuosity of the distal abdominal aorta   and common iliac arteries without significant atheromatous irregularity, dissection, or stenosis.  The C2 was exchanged for a Sos catheter, used to place a guide wire across the aortic bifurcation.  Over this, the C2 catheter was advanced.  After selective left internal iliac arteriography in multiple projections, an anterior branch of the internal iliac system supplying the region  of active extravasation seen on CT was identified and selectively catheterized with the C2 catheter.  Gelfoam slurry embolization was performed.  Follow-up left internal iliac arteriography demonstrates  cessation of flow into the treated segment, no evident complication.  No active extravasation.  In similar fashion, the C2 catheter was directed into   the right internal iliac artery. Selective right pelvic arteriography in multiple projections was obtained.  An anterior branch supplying the region of active extravasation seen on CT was identified. The branch was selectively catheterized and embolization  performed with a Gelfoam slurry.  Follow-up   right internal iliac arteriography demonstrates cessation of flow in the treated segment.  No evidence of active extravasation or other apparent complication.  The catheter was then removed.  After confirmatory right femoral arteriography, the sheath was removed and hemostasis achieved with the Exoseal device. The patient tolerated the procedure well and remained hemodynamically stable.  No immediate complication.  IMPRESSION: 1.  Technically successful Gelfoam embolization of the anterior division left internal iliac artery. 2.  Technically successful Gelfoam embolization of the anterior vision right internal iliac artery.   Original Report Authenticated By: Osa Craver, M.D.    Ir Transcath/emboliz  11/18/2011  *RADIOLOGY REPORT*  Clinical Data: 78 year old female with multiple pelvic fractures, pelvic hematoma, evidence of active bilateral extravasation in the anterior pelvis on CT.  She is currently hemodynamically stable.  PELVIC SELECTIVE ARTERIOGRAPHY,IR ULTRASOUND GUIDANCE VASC ACCESS RIGHT,TRANSCATHETER THERAPY EMBOLIZATION,ARTERIOGRAPHY,ADDITIONAL ARTERIOGRAPHY  Comparison: None.  Approach:  Right common femoral artery  Vessels catheterized:  Anterior branch left internal iliac artery (third order), anterior branch right internal iliac artery  (second order)  Technique and findings: The procedure, risks (including but not limited to bleeding, infection, organ damage), benefits, and alternatives were explained to the patient and daughter.  Questions regarding the procedure were encouraged and answered.  The patient understands  and consents to the procedure.The right femoral region was prepped and draped usual sterile fashion. Maximal barrier sterile technique was utilized including caps, mask, sterile gowns, sterile gloves, sterile drape, hand hygiene and skin antiseptic.  Intravenous Fentanyl and Versed were administered as conscious sedation during continuous cardiorespiratory monitoring by the radiology RN, with a total moderate sedation time of 120 minutes.  Under real time ultrasound guidance, the right common femoral artery was accessed with a 21-gauge micropuncture needle.  This was exchanged over a 018 guide wire for a transitional dilator, which allowed placement of a Benson wire into the abdominal aorta.  Over this, a 6-French vascular sheath was placed.  Through this, a 5- [place] C2 catheter was advanced in attempts to traverse the aortic bifurcation but because of the relatively acute angulation tortuosity, this was unsuccessful.  Aortography at the level of the bifurcation demonstrates mild tortuosity of the distal abdominal aorta   and common iliac arteries without significant atheromatous irregularity, dissection, or stenosis.  The C2 was exchanged for a Sos catheter, used to place a guide wire across the aortic bifurcation.  Over this, the C2 catheter was advanced.  After selective left internal iliac arteriography in multiple projections, an anterior branch of the internal iliac system supplying the region of active extravasation seen on CT was identified and selectively catheterized with the C2 catheter.  Gelfoam slurry embolization was performed.  Follow-up left internal iliac arteriography demonstrates  cessation of flow into the  treated segment, no evident complication.  No active extravasation.  In similar fashion, the C2 catheter was directed into   the right internal iliac artery. Selective right pelvic arteriography in multiple projections was obtained.  An anterior branch supplying the region of active extravasation seen on CT was identified. The branch was selectively catheterized and embolization  performed with a Gelfoam slurry.  Follow-up   right internal iliac arteriography demonstrates cessation of flow in the treated segment.  No evidence of active extravasation or other apparent complication.  The catheter was then removed.  After confirmatory right femoral arteriography, the sheath was removed and hemostasis achieved with the Exoseal device. The patient tolerated the procedure well and remained hemodynamically stable.  No immediate complication.  IMPRESSION: 1.  Technically successful Gelfoam embolization of the anterior division left internal iliac artery. 2.  Technically successful Gelfoam embolization of the anterior vision right internal iliac artery.   Original Report Authenticated By: Osa Craver, M.D.    Ir Angiogram Follow Up Study  11/18/2011  *RADIOLOGY REPORT*  Clinical Data: 78 year old female with multiple pelvic fractures, pelvic hematoma, evidence of active bilateral extravasation in the anterior pelvis on CT.  She is currently hemodynamically stable.  PELVIC SELECTIVE ARTERIOGRAPHY,IR ULTRASOUND GUIDANCE VASC ACCESS RIGHT,TRANSCATHETER THERAPY EMBOLIZATION,ARTERIOGRAPHY,ADDITIONAL ARTERIOGRAPHY  Comparison: None.  Approach:  Right common femoral artery  Vessels catheterized:  Anterior branch left internal iliac artery (third order), anterior branch right internal iliac artery (second order)  Technique and findings: The procedure, risks (including but not limited to bleeding, infection, organ damage), benefits, and alternatives were explained to the patient and daughter.  Questions regarding the  procedure were encouraged and answered.  The patient understands and consents to the procedure.The right femoral region was prepped and draped usual sterile fashion. Maximal barrier sterile technique was utilized including caps, mask, sterile gowns, sterile gloves, sterile drape, hand hygiene and skin antiseptic.  Intravenous Fentanyl and Versed were administered as conscious sedation during continuous cardiorespiratory monitoring by the radiology RN, with a total moderate  sedation time of 120 minutes.  Under real time ultrasound guidance, the right common femoral artery was accessed with a 21-gauge micropuncture needle.  This was exchanged over a 018 guide wire for a transitional dilator, which allowed placement of a Benson wire into the abdominal aorta.  Over this, a 6-French vascular sheath was placed.  Through this, a 5- [place] C2 catheter was advanced in attempts to traverse the aortic bifurcation but because of the relatively acute angulation tortuosity, this was unsuccessful.  Aortography at the level of the bifurcation demonstrates mild tortuosity of the distal abdominal aorta   and common iliac arteries without significant atheromatous irregularity, dissection, or stenosis.  The C2 was exchanged for a Sos catheter, used to place a guide wire across the aortic bifurcation.  Over this, the C2 catheter was advanced.  After selective left internal iliac arteriography in multiple projections, an anterior branch of the internal iliac system supplying the region of active extravasation seen on CT was identified and selectively catheterized with the C2 catheter.  Gelfoam slurry embolization was performed.  Follow-up left internal iliac arteriography demonstrates  cessation of flow into the treated segment, no evident complication.  No active extravasation.  In similar fashion, the C2 catheter was directed into   the right internal iliac artery. Selective right pelvic arteriography in multiple projections was  obtained.  An anterior branch supplying the region of active extravasation seen on CT was identified. The branch was selectively catheterized and embolization  performed with a Gelfoam slurry.  Follow-up   right internal iliac arteriography demonstrates cessation of flow in the treated segment.  No evidence of active extravasation or other apparent complication.  The catheter was then removed.  After confirmatory right femoral arteriography, the sheath was removed and hemostasis achieved with the Exoseal device. The patient tolerated the procedure well and remained hemodynamically stable.  No immediate complication.  IMPRESSION: 1.  Technically successful Gelfoam embolization of the anterior division left internal iliac artery. 2.  Technically successful Gelfoam embolization of the anterior vision right internal iliac artery.   Original Report Authenticated By: Osa Craver, M.D.    Ir Angiogram Follow Up Study  11/16/2011  Dayne Oley Balm III, MD     11/16/2011  6:12 PM Bilateral internal iliac artery anterior branch  gelfoam  embolization No complication No blood loss. See complete dictation in [facility].    Ir US Guide Vasc Access Right  11/18/2011  *RADIOLOGY REPORT*  Clinical Data: 78 year old female with multiple pelvic fractures, pelvic hematoma, evidence of active bilateral extravasation in the anterior pelvis on CT.  She is currently hemodynamically stable.  PELVIC SELECTIVE ARTERIOGRAPHY,IR ULTRASOUND GUIDANCE VASC ACCESS RIGHT,TRANSCATHETER THERAPY EMBOLIZATION,ARTERIOGRAPHY,ADDITIONAL ARTERIOGRAPHY  Comparison: None.  Approach:  Right common femoral artery  Vessels catheterized:  Anterior branch left internal iliac artery (third order), anterior branch right internal iliac artery (second order)  Technique and findings: The procedure, risks (including but not limited to bleeding, infection, organ damage), benefits, and alternatives were explained to the patient and daughter.   Questions regarding the procedure were encouraged and answered.  The patient understands and consents to the procedure.The right femoral region was prepped and draped usual sterile fashion. Maximal barrier sterile technique was utilized including caps, mask, sterile gowns, sterile gloves, sterile drape, hand hygiene and skin antiseptic.  Intravenous Fentanyl and Versed were administered as conscious sedation during continuous cardiorespiratory monitoring by the radiology RN, with a total moderate sedation time of 120 minutes.  Under real time ultrasound guidance,  the right common femoral artery was accessed with a 21-gauge micropuncture needle.  This was exchanged over a 018 guide wire for a transitional dilator, which allowed placement of a Benson wire into the abdominal aorta.  Over this, a 6-French vascular sheath was placed.  Through this, a 5- [place] C2 catheter was advanced in attempts to traverse the aortic bifurcation but because of the relatively acute angulation tortuosity, this was unsuccessful.  Aortography at the level of the bifurcation demonstrates mild tortuosity of the distal abdominal aorta   and common iliac arteries without significant atheromatous irregularity, dissection, or stenosis.  The C2 was exchanged for a Sos catheter, used to place a guide wire across the aortic bifurcation.  Over this, the C2 catheter was advanced.  After selective left internal iliac arteriography in multiple projections, an anterior branch of the internal iliac system supplying the region of active extravasation seen on CT was identified and selectively catheterized with the C2 catheter.  Gelfoam slurry embolization was performed.  Follow-up left internal iliac arteriography demonstrates  cessation of flow into the treated segment, no evident complication.  No active extravasation.  In similar fashion, the C2 catheter was directed into   the right internal iliac artery. Selective right pelvic arteriography in  multiple projections was obtained.  An anterior branch supplying the region of active extravasation seen on CT was identified. The branch was selectively catheterized and embolization  performed with a Gelfoam slurry.  Follow-up   right internal iliac arteriography demonstrates cessation of flow in the treated segment.  No evidence of active extravasation or other apparent complication.  The catheter was then removed.  After confirmatory right femoral arteriography, the sheath was removed and hemostasis achieved with the Exoseal device. The patient tolerated the procedure well and remained hemodynamically stable.  No immediate complication.  IMPRESSION: 1.  Technically successful Gelfoam embolization of the anterior division left internal iliac artery. 2.  Technically successful Gelfoam embolization of the anterior vision right internal iliac artery.   Original Report Authenticated By: Osa Craver, M.D.    Dg Abd Acute W/chest  11/16/2011  *RADIOLOGY REPORT*  Clinical Data: Abdominal distention, abdominal pain  ACUTE ABDOMEN SERIES (ABDOMEN 2 VIEW & CHEST 1 VIEW)  Comparison: Abdominal film 03/11/2008  Findings: Mild enlarged cardiac silhouette with ectatic aorta. There is no free air beneath hemidiaphragms.  Lungs are hyperinflated but clear.  The left lateral decubitus view demonstrates no intraperitoneal free air.  No dilated dilated loops of large or small bowel.  There is gas the rectum.  There is a left hip internal fixation.  There is irregularity along the superior pubic rami on the left.  IMPRESSION:  1.  No acute cardiopulmonary findings. 2.  No evidence of bowel obstruction or intraperitoneal free air. 3.  Concern for left pubic rami fracture.  Recommend dedicated views of the pelvis or CT for further evaluation.   Original Report Authenticated By: Genevive Bi, M.D.     Medications: I have reviewed the patient's current medications. Scheduled Meds:   . docusate sodium  100 mg Oral  BID  . furosemide  40 mg Intravenous Once  . potassium chloride  10 mEq Intravenous Q1 Hr x 3   Continuous Infusions:   . sodium chloride 0.9 % 1,000 mL with potassium chloride 40 mEq infusion     PRN Meds:.acetaminophen, acetaminophen, albuterol, HYDROcodone-acetaminophen, morphine injection, ondansetron (ZOFRAN) IV, ondansetron, polyethylene glycol Assessment/Plan: Patient Active Hospital Problem List:  Pelvic hematoma, female (11/16/2011) PT is s/p  gelfoam embolization  And s/p  Transfusion of 2 Units  PRBC's.  Hb is stable at present. Continue to monitor.  Bilateral pubic rami fractures (11/16/2011) Appreciate Ortho input. PT/OT. OOB as tolerated.   Anemia due to blood loss, acute (11/16/2011) S/PTransfusion of 2 Units  PRBC's.  Hb is stable at present. Continue to monitor.  Atrial Fibrillation Pt is now in Atrial Fibrillation with HR in 80's. I suspect that this is related to her acute medical condition and electrolyte derangement.  Will correct electrolytes and reevaluate cardiac rhythm. If she continues to remain in atrial fibrillation, she will likely require cardiology input and  Consideration of anticoagulation (?? In light of hematoma).  Hypokalemia Replete by IV. Check magnesium  Hyponatremia  Pt appears volume depleted and Urine appears concentrated. I will give some volume resuscitation and recheck electrolytes this evening. I suspect that this is contributing to patient feeling poorly.  Leukocytosis  Resolved  HTN (hypertension) BP adequately but not optimally controlled.  Protein Calorie Malnutrition Pt appears chronically malnourished and has had reported poor intake. Will obtain a nutrition consultation.      LOS: 3 days

## 2011-11-19 NOTE — Progress Notes (Signed)
Physical Therapy Treatment Patient Details Name: Sharon Harrison MRN: [id number] DOB: 1915/01/19 Today's Date: 11/19/2011 Time: 4098-1191 PT Time Calculation (min): 19 min  PT Assessment / Plan / Recommendation Comments on Treatment Session  Pt. continues to be anxious about moving. Pt. did not c/o of much in the manner she was moved with the bed pad. Plans for SNF    Follow Up Recommendations  Post acute inpatient     Does the patient have the potential to tolerate intense rehabilitation  No, Recommend SNF  Barriers to Discharge        Equipment Recommendations  None recommended by PT    Recommendations for Other Services    Frequency Min 3X/week   Plan Discharge plan remains appropriate;Frequency remains appropriate    Precautions / Restrictions Precautions Precautions: Fall Restrictions RLE Weight Bearing: Weight bearing as tolerated LLE Weight Bearing: Weight bearing as tolerated   Pertinent Vitals/Pain Reports L side hurts.     Mobility  Bed Mobility Supine to Sit: 1: +2 Total assist Supine to Sit: Patient Percentage: 0% Sit to Supine: 1: +2 Total assist Sit to Supine: Patient Percentage: 0% Details for Bed Mobility Assistance: use of bed pad to slide pt around in bed and back to bed. Transfers Transfers: Not assessed    Exercises General Exercises - Lower Extremity Long Arc Quad: Barbaraann Boys;Both;5 reps;Seated   PT Diagnosis:    PT Problem List:   PT Treatment Interventions:     PT Goals Acute Rehab PT Goals Pt will go Supine/Side to Sit: with min assist PT Goal: Supine/Side to Sit - Progress: Progressing toward goal Pt will Sit at [facility] of Bed: with min assist;6-10 min;with bilateral upper extremity support Pt will go Sit to Supine/Side: with min assist PT Goal: Sit to Supine/Side - Progress: Progressing toward goal  Visit Information  Last PT Received On: 11/19/11 Assistance Needed: +2    Subjective Data  Subjective: i don't want to get up    Cognition  Arousal/Alertness: Awake/alert Orientation Level: Appears intact for tasks assessed Behavior During Session: Anxious    Balance  Static Sitting Balance Static Sitting - Comment/# of Minutes: pt hold onto rail. difficult to wt shift to L  End of Session PT - End of Session Activity Tolerance: Patient tolerated treatment well (pt anxious abou moving.) Patient left: in bed;with call bell/phone within reach;with bed alarm set Nurse Communication: Mobility status   GP     Rada Hay 11/19/2011, 3:02 PM

## 2011-11-19 NOTE — Evaluation (Signed)
Occupational Therapy Evaluation Patient Details Name: Sharon Harrison MRN: [id number] DOB: Sep 22, 1914 Today's Date: 11/19/2011 Time: 2130-8657 OT Time Calculation (min): 20 min  OT Assessment / Plan / Recommendation Clinical Impression       OT Assessment  Patient needs continued OT Services    Follow Up Recommendations  Skilled nursing facility    Barriers to Discharge      Equipment Recommendations  None recommended by PT    Recommendations for Other Services    Frequency  Min 1X/week    Precautions / Restrictions Precautions Precautions: Fall Restrictions RLE Weight Bearing: Weight bearing as tolerated LLE Weight Bearing: Weight bearing as tolerated   Pertinent Vitals/Pain Back painful but pt did not rate.  Repositioned and pain improved    ADL  Eating/Feeding: Simulated;Set up Where Assessed - Eating/Feeding: Bed level Grooming: Performed;Brushing hair;Supervision/safety Where Assessed - Grooming: Supported sitting (at eob) Upper Body Bathing: Simulated;Moderate assistance Where Assessed - Upper Body Bathing: Supine, head of bed up Lower Body Bathing: +2 Total assistance Lower Body Bathing: Patient Percentage: 0% Where Assessed - Lower Body Bathing: Rolling right and/or left Upper Body Dressing: Simulated;Moderate assistance Where Assessed - Upper Body Dressing: Supine, head of bed up Lower Body Dressing: Simulated;+2 Total assistance Lower Body Dressing: Patient Percentage: 0% Where Assessed - Lower Body Dressing: Rolling right and/or left Transfers/Ambulation Related to ADLs: pt tolerated sitting eob x 8 minutes with mod A for trunk support balance.  Pt leans to R and backwards.  Able to sustain position near neutral for 5 seconds once placed.  PT/OT assisted her to eob to (pt 0%) to manage pain.  Pt stated she had pain when asked but did not rate.  Better when repositioned in bed ADL Comments: Needs assist for adls.  LUE has limited AROM    OT Diagnosis:  Generalized weakness  OT Problem List: Decreased strength;Decreased activity tolerance;Impaired balance (sitting and/or standing);Impaired UE functional use;Pain OT Treatment Interventions: Self-care/ADL training;DME and/or AE instruction;Therapeutic exercise;Therapeutic activities;Patient/family education;Balance training   OT Goals Acute Rehab OT Goals OT Goal Formulation: With patient Time For Goal Achievement: 12/03/11 Potential to Achieve Goals: Good Miscellaneous OT Goals Miscellaneous OT Goal #1: Pt will sit at eob x 5 minutes with min A, near midline in preparation for seated adls OT Goal: Miscellaneous Goal #1 - Progress: Goal set today Miscellaneous OT Goal #2: Pt will assist with bed mobility, total A x 2, pt 30% in preparation for transfer to 3:1 OT Goal: Miscellaneous Goal #2 - Progress: Goal set today Miscellaneous OT Goal #3: Pt will perform UB adls in supported sitting, HOB raised with min A OT Goal: Miscellaneous Goal #3 - Progress: Goal set today  Visit Information  Last OT Received On: 11/19/11 Assistance Needed: +2 PT/OT Co-Evaluation/Treatment: Yes    Subjective Data  Subjective: I live at [place] Patient Stated Goal: none stated but agreeable to ot/pt   Prior Functioning     Home Living Lives With: Alone Available Help at Discharge: Personal care attendant;Family Type of Home: Apartment Home Access: Level entry Home Layout: One level Additional Comments: lives at [employer].  Used walker or w/c to get to dining room Prior Function Level of Independence: Independent with assistive device(s) Communication Communication: No difficulties Dominant Hand: Right         Vision/Perception     Cognition  Arousal/Alertness: Awake/alert Orientation Level: Appears intact for tasks assessed Behavior During Session: Anxious    Extremity/Trunk Assessment       Mobility Bed Mobility  Supine to Sit: 1: +2 Total assist Supine to Sit: Patient  Percentage: 0% Sit to Supine: 1: +2 Total assist Sit to Supine: Patient Percentage: 0% Details for Bed Mobility Assistance: use of bed pad to slide pt around in bed and back to bed.     Shoulder Instructions     Exercise    Balance Static Sitting Balance Static Sitting - Comment/# of Minutes: pt hold onto rail. difficult to wt shift to L   End of Session    GO     Sharon Harrison 11/19/2011, 3:37 PM Marica Otter, OTR/L [phone] 11/19/2011

## 2011-11-20 LAB — BASIC METABOLIC PANEL
CO2: 22 mEq/L (ref 19–32)
Calcium: 8.1 mg/dL — ABNORMAL LOW (ref 8.4–10.5)
GFR calc non Af Amer: 87 mL/min — ABNORMAL LOW (ref >90)
Sodium: 121 mEq/L — ABNORMAL LOW (ref 135–145)

## 2011-11-20 LAB — RENAL FUNCTION PANEL
CO2: 23 mEq/L (ref 19–32)
Calcium: 8.3 mg/dL — ABNORMAL LOW (ref 8.4–10.5)
GFR calc Af Amer: >90 mL/min (ref >90)
Glucose, Bld: 105 mg/dL — ABNORMAL HIGH (ref 70–99)
Sodium: 125 mEq/L — ABNORMAL LOW (ref 135–145)

## 2011-11-20 MED ORDER — BOOST / RESOURCE BREEZE PO LIQD
1.0000 | Freq: Two times a day (BID) | ORAL | Status: DC
  Administered 2011-11-20 – 2011-11-27 (×11): 1 via ORAL

## 2011-11-20 NOTE — Progress Notes (Signed)
Physical Therapy Treatment Patient Details Name: Sharon Harrison MRN: [id number] DOB: October 18, 1914 Today's Date: 11/20/2011 Time: 4782-9562 PT Time Calculation (min): 31 min  PT Assessment / Plan / Recommendation Comments on Treatment Session  Pt is pleasant and oriented prior living @ [facility] @ [facility].  Pt is very fearful during activity and requires increased time and max  positive reinforcement. Assisted pt OOB to [facility] for a small BM then to recliner.  Pt will need SNF level for ST rehab.    Follow Up Recommendations   (Skilled Nursing)     Does the patient have the potential to tolerate intense rehabilitation     Barriers to Discharge        Equipment Recommendations       Recommendations for Other Services    Frequency Min 3X/week   Plan Discharge plan remains appropriate    Precautions / Restrictions Precautions Precautions: Fall Precaution Comments: pelvic Fx Restrictions Weight Bearing Restrictions: No RLE Weight Bearing: Weight bearing as tolerated LLE Weight Bearing: Weight bearing as tolerated    Pertinent Vitals/Pain C/o "alot" during mvt    Mobility  Bed Mobility Bed Mobility: Supine to Sit;Sitting - Scoot to Edge of Bed Supine to Sit: 1: +2 Total assist;HOB elevated Supine to Sit: Patient Percentage: 10% Sitting - Scoot to Edge of Bed: 1: +2 Total assist Sitting - Scoot to Edge of Bed: Patient Percentage: 0% Details for Bed Mobility Assistance: use of bed pad to slide pt around in bed   Transfers Transfers: Sit to Stand;Stand to Sit Sit to Stand: 1: +2 Total assist;From bed;From toilet Sit to Stand: Patient Percentage: 10% Stand to Sit: 1: +2 Total assist;To toilet;To chair/3-in-1 Details for Transfer Assistance: MAX VC's to decrease fear/anxiety and segmental MVTs  Ambulation/Gait Ambulation/Gait Assistance Details: attempted however pt unable to support own body weight plus incearsed fear/anxiety    PT Goals             progressing    Visit Information  Last PT Received On: 11/20/11 Assistance Needed: +2    Subjective Data      Cognition       Balance     End of Session PT - End of Session Equipment Utilized During Treatment: Gait belt Activity Tolerance: Patient limited by pain Patient left: in chair;with call bell/phone within reach;with family/visitor present Nurse Communication: Mobility status   Felecia Shelling  PTA [facility]  Acute  Rehab Pager     [phone]

## 2011-11-20 NOTE — Progress Notes (Signed)
CSW provided patient's daughter with bed offers earlier today - daughter expressed interest in [facility] as backup facilities incase [facility] has no openings at discharge. CSW left message for daughter to follow-up with SNF decision.   Clinical Social Work Department CLINICAL SOCIAL WORK PLACEMENT NOTE 11/20/2011  Patient:  Sharon Harrison, Sharon Harrison  Account Number:  [id number] Admit date:  11/16/2011  Clinical Social Worker:  Orpah Greek  Date/time:  11/18/2011 11:48 AM  Clinical Social Work is seeking post-discharge placement for this patient at the following level of care:   SKILLED NURSING   (*CSW will update this form in Epic as items are completed)   11/18/2011  Patient/family provided with [facility] Department of Clinical Social Work's list of facilities offering this level of care within the geographic area requested by the patient (or if unable, by the patient's family).  11/18/2011  Patient/family informed of their freedom to choose among providers that offer the needed level of care, that participate in Medicare, Medicaid or managed care program needed by the patient, have an available bed and are willing to accept the patient.  11/18/2011  Patient/family informed of [facility]' ownership interest in [facility], as well as of the fact that they are under no obligation to receive care at this facility.  PASARR submitted to EDS on 11/18/2011 PASARR number received from EDS on 11/18/2011  FL2 transmitted to all facilities in geographic area requested by pt/family on  11/18/2011 FL2 transmitted to all facilities within larger geographic area on   Patient informed that his/her managed care company has contracts with or will negotiate with  certain facilities, including the following:     Patient/family informed of bed offers received:  11/20/2011 Patient chooses bed at  Physician recommends and patient chooses bed at    Patient to be transferred  to  on   Patient to be transferred to facility by   The following physician request were entered in Epic:   Additional Comments:    Unice Bailey, LCSW [facility] Clinical Social Worker cell #: [phone]

## 2011-11-20 NOTE — Progress Notes (Signed)
INITIAL ADULT NUTRITION ASSESSMENT Date: 11/20/2011   Time: 10:40 AM Reason for Assessment: Consult for assessment of nutrition status/ requirements  ASSESSMENT: Female 78 y.o.  Dx: Pelvic hematoma, female  Hx:  Past Medical History  Diagnosis Date  . Osteoporosis   . Compression fracture   . Hypertension   . Pulmonary hypertension   . Mitral valve regurgitation     Related Meds:  Scheduled Meds:   . docusate sodium  100 mg Oral BID  . magnesium sulfate 1 - 4 g bolus IVPB  2 g Intravenous Once  . potassium chloride  10 mEq Intravenous Q1 Hr x 3   Continuous Infusions:   . sodium chloride 0.9 % 1,000 mL with potassium chloride 40 mEq infusion 125 mL/hr at 11/20/11 0930   PRN Meds:.acetaminophen, acetaminophen, albuterol, HYDROcodone-acetaminophen, morphine injection, ondansetron (ZOFRAN) IV, ondansetron, polyethylene glycol   Ht: 5\' 4"  (162.6 cm)  Wt: 110 lb 3.7 oz (50 kg)  Ideal Wt: 54.7 kg  % Ideal Wt: 92% Wt Readings from Last 10 Encounters:  11/16/11 110 lb 3.7 oz (50 kg)    Body mass index is 18.92 kg/(m^2). (WNL)  Food/Nutrition Related Hx: Patient seemed slightly confused at time of RD visit. She reported she has a poor appetite and little PO intake, unable to quantify how long. PO intake documented 25% at meals. Patient appears thin. She agreed to try Resource breeze nutrition supplement to increase PO intake.   Labs:  CMP     Component Value Date/Time   NA 125* 11/20/2011 0440   K 3.7 11/20/2011 0440   CL 94* 11/20/2011 0440   CO2 23 11/20/2011 0440   GLUCOSE 105* 11/20/2011 0440   BUN 11 11/20/2011 0440   CREATININE 0.40* 11/20/2011 0440   CALCIUM 8.3* 11/20/2011 0440   PROT 4.9* 11/17/2011 0350   ALBUMIN 2.4* 11/20/2011 0440   AST 52* 11/17/2011 0350   ALT 24 11/17/2011 0350   ALKPHOS 40 11/17/2011 0350   BILITOT 0.4 11/17/2011 0350   GFRNONAA 86* 11/20/2011 0440   GFRAA >90 11/20/2011 0440    Intake/Output Summary (Last 24 hours) at  11/20/11 1041 Last data filed at 11/20/11 0856  Gross per 24 hour  Intake 2888.75 ml  Output   1000 ml  Net 1888.75 ml      Diet Order: General  Supplements/Tube Feeding: none at this time   IVF:    sodium chloride 0.9 % 1,000 mL with potassium chloride 40 mEq infusion Last Rate: 125 mL/hr at 11/20/11 0930    Estimated Nutritional Needs:   Kcal: 1360-1630 Protein: 65-81 grams  Fluid: 1 ml per kcal intake.   NUTRITION DIAGNOSIS: -Inadequate oral intake (NI-2.1).  Status: Ongoing  RELATED TO: poor appetite  AS EVIDENCE BY: PO intake documented 25% at meals   MONITORING/EVALUATION(Goals): PO intake, weights, labs 1. PO intake > 50% at meals and supplements.   EDUCATION NEEDS: -No education needs identified at this time  INTERVENTION: 1. Will order patient Resource Breeze BID, provides 500 kcal and 18 grams of protein daily.  2. RD to follow for nutrition plan of care.   Dietitian [phone]  DOCUMENTATION CODES Per approved criteria  -Not Applicable    Iven Finn [facility] 11/20/2011, 10:40 AM

## 2011-11-20 NOTE — Progress Notes (Signed)
Physical Therapy Treatment Patient Details Name: Sharon Harrison MRN: [id number] DOB: 26-Feb-1914 Today's Date: 11/20/2011 Time: 7829-5621 PT Time Calculation (min): 15 min  PT Assessment / Plan / Recommendation Comments on Treatment Session  Asssited pt back to bed and posiyioned to comfort and pressure relief.    Follow Up Recommendations   (Skilled Nursing)     Does the patient have the potential to tolerate intense rehabilitation     Barriers to Discharge        Equipment Recommendations       Recommendations for Other Services    Frequency Min 3X/week   Plan Discharge plan remains appropriate    Precautions / Restrictions Precautions Precautions: Fall Precaution Comments: pelvic Fx Restrictions Weight Bearing Restrictions: No RLE Weight Bearing: Weight bearing as tolerated LLE Weight Bearing: Weight bearing as tolerated   Pertinent Vitals/Pain C/o back pain  RN present    Mobility  Bed Mobility Bed Mobility: Sit to Supine Supine to Sit: 1: +2 Total assist;HOB elevated Supine to Sit: Patient Percentage: 10% Sitting - Scoot to Edge of Bed: 1: +2 Total assist Sitting - Scoot to Edge of Bed: Patient Percentage: 0% Sit to Supine: 1: +2 Total assist Sit to Supine: Patient Percentage: 10% Details for Bed Mobility Assistance: increased time and use of pad to move pt up in bed.  Max assist for positioning. Transfers Transfers: Stand Pivot Transfers Sit to Stand: 1: +2 Total assist;From bed;From toilet Sit to Stand: Patient Percentage: 10% Stand to Sit: 1: +2 Total assist;To toilet;To chair/3-in-1 Stand Pivot Transfers: 1: +1 Total assist Details for Transfer Assistance: MAX VC's to decrease fear/anxiety and segmental MVTs  Ambulation/Gait Ambulation/Gait Assistance Details: non amb at this time     PT Goals progressing    Visit Information  Last PT Received On: 11/20/11 Assistance Needed: +2    Subjective Data      Cognition       Balance     End of  Session PT - End of Session Equipment Utilized During Treatment: Gait belt Activity Tolerance: Patient limited by pain;Patient limited by fatigue Patient left: in bed;with call bell/phone within reach Nurse Communication: Mobility status   Felecia Shelling  PTA [facility]  Acute  Rehab Pager     [phone]

## 2011-11-20 NOTE — Progress Notes (Signed)
Subjective: PT sitting up in chair and states that she feels much better today. The patient appears much more energetic and lively.   Interval history: Pt continues in A-fib this morning with HR presently in the 70s to 86 .  Objective: Filed Vitals:   11/19/11 1445 11/19/11 2222 11/20/11 0507 11/20/11 1430  BP: 146/75 155/90 164/84 166/75  Pulse: 95 107 80 91  Temp: 98.2 F (36.8 C) 97.9 F (36.6 C) 98.4 F (36.9 C) 98.5 F (36.9 C)  TempSrc: Oral Oral Oral Oral  Resp: 18 16 18 18   Height:      Weight:      SpO2: 98% 97% 96% 99%   Weight change:   Intake/Output Summary (Last 24 hours) at 11/20/11 1847 Last data filed at 11/20/11 1500  Gross per 24 hour  Intake   3445 ml  Output   1300 ml  Net   2145 ml    General: Alert, awake, oriented x3, in no acute distress. Nontoxic appearing today HEENT: [place]/AT [place], EOMI OROPHARYNX:  Moist, No exudate/ erythema/lesions.  Heart: Regular rate and rhythm, without murmurs, rubs, gallops, PMI non-displaced, no heaves or thrills on palpation.  Lungs: Clear to auscultation, no wheezing or rhonchi noted. No increased vocal fremitus resonant to percussion  Abdomen: Soft, nontender, nondistended, positive bowel sounds, no masses no hepatosplenomegaly noted..  Musculoskeletal: No warm swelling or erythema around joints, no spinal tenderness noted.    Lab Results:  Basename 11/20/11 1604 11/20/11 0440 11/19/11 0507  NA 121* 125* --  K 4.4 3.7 --  CL 93* 94* --  CO2 22 23 --  GLUCOSE 124* 105* --  BUN 10 11 --  CREATININE 0.39* 0.40* --  CALCIUM 8.1* 8.3* --  MG -- -- 1.6  PHOS -- 1.3* --    Basename 11/20/11 0440  AST --  ALT --  ALKPHOS --  BILITOT --  PROT --  ALBUMIN 2.4*   No results found for this basename: LIPASE:2,AMYLASE:2 in the last 72 hours  Basename 11/19/11 0507 11/19/11 0005  WBC 9.6 11.0*  NEUTROABS -- --  HGB 10.9* 10.8*  HCT 30.7* 29.9*  MCV 85.0 84.5  PLT 115* 111*   No results found for this  basename: CKTOTAL:3,CKMB:3,CKMBINDEX:3,TROPONINI:3 in the last 72 hours No components found with this basename: POCBNP:3 No results found for this basename: DDIMER:2 in the last 72 hours No results found for this basename: HGBA1C:2 in the last 72 hours No results found for this basename: CHOL:2,HDL:2,LDLCALC:2,TRIG:2,CHOLHDL:2,LDLDIRECT:2 in the last 72 hours No results found for this basename: TSH,T4TOTAL,FREET3,T3FREE,THYROIDAB in the last 72 hours No results found for this basename: VITAMINB12:2,FOLATE:2,FERRITIN:2,TIBC:2,IRON:2,RETICCTPCT:2 in the last 72 hours  Micro Results: Recent Results (from the past 240 hour(s))  MRSA PCR SCREENING     Status: Normal   Collection Time   11/16/11  9:29 PM      Component Value Range Status Comment   MRSA by PCR NEGATIVE  NEGATIVE Final     Studies/Results: Ct Head Wo Contrast  11/16/2011  *RADIOLOGY REPORT*  Clinical Data:  78 year old female with headache and neck pain following fall.  CT HEAD WITHOUT CONTRAST CT CERVICAL SPINE WITHOUT CONTRAST  Technique:  Multidetector CT imaging of the head and cervical spine was performed following the standard protocol without intravenous contrast.  Multiplanar CT image reconstructions of the cervical spine were also generated.  Comparison:  05/22/2005.  CT HEAD  Findings: Atrophy and chronic small vessel white matter ischemic changes are again identified.  No acute  intracranial abnormalities are identified, including mass lesion or mass effect, hydrocephalus, extra-axial fluid collection, midline shift, hemorrhage, or acute infarction.  The visualized bony calvarium is unremarkable.  IMPRESSION: No evidence of acute intracranial abnormality.  Atrophy and chronic small vessel white matter ischemic changes.  CT CERVICAL SPINE  Findings: 1.5 mm retrolisthesis of C5 in relation to C4-C6 is identified, almost certainly degenerative. There is no evidence of acute fracture or prevertebral soft tissue swelling. Diffuse  osteopenia and moderate facet arthropathy throughout the cervical spine noted. Severe degenerative disc disease at C4-C5 an C5-C6 noted. No focal bony lesions are present. No soft tissue abnormalities are noted.  IMPRESSION: No static evidence of acute injury to the cervical spine.  Mild retrolisthesis of C5 with severe degenerative changes at C4-C5 and C5-C6.  Diffuse osteopenia.   Original Report Authenticated By: Rosendo Gros, M.D.    Ct Cervical Spine Wo Contrast  11/16/2011  *RADIOLOGY REPORT*  Clinical Data:  78 year old female with headache and neck pain following fall.  CT HEAD WITHOUT CONTRAST CT CERVICAL SPINE WITHOUT CONTRAST  Technique:  Multidetector CT imaging of the head and cervical spine was performed following the standard protocol without intravenous contrast.  Multiplanar CT image reconstructions of the cervical spine were also generated.  Comparison:  05/22/2005.  CT HEAD  Findings: Atrophy and chronic small vessel white matter ischemic changes are again identified.  No acute intracranial abnormalities are identified, including mass lesion or mass effect, hydrocephalus, extra-axial fluid collection, midline shift, hemorrhage, or acute infarction.  The visualized bony calvarium is unremarkable.  IMPRESSION: No evidence of acute intracranial abnormality.  Atrophy and chronic small vessel white matter ischemic changes.  CT CERVICAL SPINE  Findings: 1.5 mm retrolisthesis of C5 in relation to C4-C6 is identified, almost certainly degenerative. There is no evidence of acute fracture or prevertebral soft tissue swelling. Diffuse osteopenia and moderate facet arthropathy throughout the cervical spine noted. Severe degenerative disc disease at C4-C5 an C5-C6 noted. No focal bony lesions are present. No soft tissue abnormalities are noted.  IMPRESSION: No static evidence of acute injury to the cervical spine.  Mild retrolisthesis of C5 with severe degenerative changes at C4-C5 and C5-C6.  Diffuse  osteopenia.   Original Report Authenticated By: Rosendo Gros, M.D.    Ct Abdomen Pelvis W Contrast  11/16/2011  *RADIOLOGY REPORT*  Clinical Data: Pain post fall.  Possible pelvic fracture.  CT ABDOMEN AND PELVIS WITH CONTRAST  Technique:  Multidetector CT imaging of the abdomen and pelvis was performed following the standard protocol during bolus administration of intravenous contrast.  Contrast: OMNIPAQUE IOHEXOL 300 MG/ML  SOLN  Comparison: None.  Findings: Linear scarring or subsegmental atelectasis posteriorly in the visualized lung bases.  Calcified granuloma in the inferior lingula.  Sub centimeter subcapsular probable cyst in the medial left hepatic segment.  Otherwise unremarkable liver, gallbladder, spleen, adrenal glands, right kidney.  There is a small probable cyst in the upper pole left kidney.  No hydronephrosis.  Stomach and small bowel are nondistended.  Moderate fecal material in the colon. No definite ascites. No free air.  Minimally-displaced fractures of bilateral superior and inferior ischiopubic rami, comminuted and displaced on the left.  There is a moderately large prevesical, extraperitoneal, left retroperitoneal/presacral and anterior body wall hematoma.  There are at least three focal areas of probable active extravasation noted, two just cephalad to the left superior ischiopubic ramus, the third  posterior to the right superior pubic ramus.  There is a minimally-displaced  fracture through the left sacral ala without definite involvement of the sacral foramina.  Fixation hardware across the left femoral neck is partially seen. Progressive vertebral compression fracture deformities of T11, T12, L1, L2, and L4 since previous films of 03/11/2008.  There is mild retropulsion into the spinal canal at T11, T12, L1, and L2.  No displaced posterior element fractures evident.  IMPRESSION:  1.  Acute fractures of superior and inferior ischiopubic rami, with a moderately large pelvic  hematoma and evidence of bilateral active extravasation. I telephoned the critical test results to Dr. Fredderick Phenix at the time of interpretation. 2.  Minimally-displaced fracture of the left sacral ala. 3.  Multiple thoracolumbar compression fracture deformities, progressive since 03/11/2008.   Original Report Authenticated By: Osa Craver, M.D.    Ir Angiogram Pelvis Selective Or Supraselective  11/18/2011  *RADIOLOGY REPORT*  Clinical Data: 78 year old female with multiple pelvic fractures, pelvic hematoma, evidence of active bilateral extravasation in the anterior pelvis on CT.  She is currently hemodynamically stable.  PELVIC SELECTIVE ARTERIOGRAPHY,IR ULTRASOUND GUIDANCE VASC ACCESS RIGHT,TRANSCATHETER THERAPY EMBOLIZATION,ARTERIOGRAPHY,ADDITIONAL ARTERIOGRAPHY  Comparison: None.  Approach:  Right common femoral artery  Vessels catheterized:  Anterior branch left internal iliac artery (third order), anterior branch right internal iliac artery (second order)  Technique and findings: The procedure, risks (including but not limited to bleeding, infection, organ damage), benefits, and alternatives were explained to the patient and daughter.  Questions regarding the procedure were encouraged and answered.  The patient understands and consents to the procedure.The right femoral region was prepped and draped usual sterile fashion. Maximal barrier sterile technique was utilized including caps, mask, sterile gowns, sterile gloves, sterile drape, hand hygiene and skin antiseptic.  Intravenous Fentanyl and Versed were administered as conscious sedation during continuous cardiorespiratory monitoring by the radiology RN, with a total moderate sedation time of 120 minutes.  Under real time ultrasound guidance, the right common femoral artery was accessed with a 21-gauge micropuncture needle.  This was exchanged over a 018 guide wire for a transitional dilator, which allowed placement of a Benson wire into the  abdominal aorta.  Over this, a 6-French vascular sheath was placed.  Through this, a 5- [place] C2 catheter was advanced in attempts to traverse the aortic bifurcation but because of the relatively acute angulation tortuosity, this was unsuccessful.  Aortography at the level of the bifurcation demonstrates mild tortuosity of the distal abdominal aorta   and common iliac arteries without significant atheromatous irregularity, dissection, or stenosis.  The C2 was exchanged for a Sos catheter, used to place a guide wire across the aortic bifurcation.  Over this, the C2 catheter was advanced.  After selective left internal iliac arteriography in multiple projections, an anterior branch of the internal iliac system supplying the region of active extravasation seen on CT was identified and selectively catheterized with the C2 catheter.  Gelfoam slurry embolization was performed.  Follow-up left internal iliac arteriography demonstrates  cessation of flow into the treated segment, no evident complication.  No active extravasation.  In similar fashion, the C2 catheter was directed into   the right internal iliac artery. Selective right pelvic arteriography in multiple projections was obtained.  An anterior branch supplying the region of active extravasation seen on CT was identified. The branch was selectively catheterized and embolization  performed with a Gelfoam slurry.  Follow-up   right internal iliac arteriography demonstrates cessation of flow in the treated segment.  No evidence of active extravasation or other apparent complication.  The  catheter was then removed.  After confirmatory right femoral arteriography, the sheath was removed and hemostasis achieved with the Exoseal device. The patient tolerated the procedure well and remained hemodynamically stable.  No immediate complication.  IMPRESSION: 1.  Technically successful Gelfoam embolization of the anterior division left internal iliac artery. 2.  Technically  successful Gelfoam embolization of the anterior vision right internal iliac artery.   Original Report Authenticated By: Osa Craver, M.D.    Ir Angiogram Pelvis Selective Or Supraselective  11/18/2011  *RADIOLOGY REPORT*  Clinical Data: 78 year old female with multiple pelvic fractures, pelvic hematoma, evidence of active bilateral extravasation in the anterior pelvis on CT.  She is currently hemodynamically stable.  PELVIC SELECTIVE ARTERIOGRAPHY,IR ULTRASOUND GUIDANCE VASC ACCESS RIGHT,TRANSCATHETER THERAPY EMBOLIZATION,ARTERIOGRAPHY,ADDITIONAL ARTERIOGRAPHY  Comparison: None.  Approach:  Right common femoral artery  Vessels catheterized:  Anterior branch left internal iliac artery (third order), anterior branch right internal iliac artery (second order)  Technique and findings: The procedure, risks (including but not limited to bleeding, infection, organ damage), benefits, and alternatives were explained to the patient and daughter.  Questions regarding the procedure were encouraged and answered.  The patient understands and consents to the procedure.The right femoral region was prepped and draped usual sterile fashion. Maximal barrier sterile technique was utilized including caps, mask, sterile gowns, sterile gloves, sterile drape, hand hygiene and skin antiseptic.  Intravenous Fentanyl and Versed were administered as conscious sedation during continuous cardiorespiratory monitoring by the radiology RN, with a total moderate sedation time of 120 minutes.  Under real time ultrasound guidance, the right common femoral artery was accessed with a 21-gauge micropuncture needle.  This was exchanged over a 018 guide wire for a transitional dilator, which allowed placement of a Benson wire into the abdominal aorta.  Over this, a 6-French vascular sheath was placed.  Through this, a 5- [place] C2 catheter was advanced in attempts to traverse the aortic bifurcation but because of the relatively acute  angulation tortuosity, this was unsuccessful.  Aortography at the level of the bifurcation demonstrates mild tortuosity of the distal abdominal aorta   and common iliac arteries without significant atheromatous irregularity, dissection, or stenosis.  The C2 was exchanged for a Sos catheter, used to place a guide wire across the aortic bifurcation.  Over this, the C2 catheter was advanced.  After selective left internal iliac arteriography in multiple projections, an anterior branch of the internal iliac system supplying the region of active extravasation seen on CT was identified and selectively catheterized with the C2 catheter.  Gelfoam slurry embolization was performed.  Follow-up left internal iliac arteriography demonstrates  cessation of flow into the treated segment, no evident complication.  No active extravasation.  In similar fashion, the C2 catheter was directed into   the right internal iliac artery. Selective right pelvic arteriography in multiple projections was obtained.  An anterior branch supplying the region of active extravasation seen on CT was identified. The branch was selectively catheterized and embolization  performed with a Gelfoam slurry.  Follow-up   right internal iliac arteriography demonstrates cessation of flow in the treated segment.  No evidence of active extravasation or other apparent complication.  The catheter was then removed.  After confirmatory right femoral arteriography, the sheath was removed and hemostasis achieved with the Exoseal device. The patient tolerated the procedure well and remained hemodynamically stable.  No immediate complication.  IMPRESSION: 1.  Technically successful Gelfoam embolization of the anterior division left internal iliac artery. 2.  Technically successful Gelfoam  embolization of the anterior vision right internal iliac artery.   Original Report Authenticated By: Osa Craver, M.D.    Ir Angiogram Selective Each Additional  Vessel  11/18/2011  *RADIOLOGY REPORT*  Clinical Data: 78 year old female with multiple pelvic fractures, pelvic hematoma, evidence of active bilateral extravasation in the anterior pelvis on CT.  She is currently hemodynamically stable.  PELVIC SELECTIVE ARTERIOGRAPHY,IR ULTRASOUND GUIDANCE VASC ACCESS RIGHT,TRANSCATHETER THERAPY EMBOLIZATION,ARTERIOGRAPHY,ADDITIONAL ARTERIOGRAPHY  Comparison: None.  Approach:  Right common femoral artery  Vessels catheterized:  Anterior branch left internal iliac artery (third order), anterior branch right internal iliac artery (second order)  Technique and findings: The procedure, risks (including but not limited to bleeding, infection, organ damage), benefits, and alternatives were explained to the patient and daughter.  Questions regarding the procedure were encouraged and answered.  The patient understands and consents to the procedure.The right femoral region was prepped and draped usual sterile fashion. Maximal barrier sterile technique was utilized including caps, mask, sterile gowns, sterile gloves, sterile drape, hand hygiene and skin antiseptic.  Intravenous Fentanyl and Versed were administered as conscious sedation during continuous cardiorespiratory monitoring by the radiology RN, with a total moderate sedation time of 120 minutes.  Under real time ultrasound guidance, the right common femoral artery was accessed with a 21-gauge micropuncture needle.  This was exchanged over a 018 guide wire for a transitional dilator, which allowed placement of a Benson wire into the abdominal aorta.  Over this, a 6-French vascular sheath was placed.  Through this, a 5- [place] C2 catheter was advanced in attempts to traverse the aortic bifurcation but because of the relatively acute angulation tortuosity, this was unsuccessful.  Aortography at the level of the bifurcation demonstrates mild tortuosity of the distal abdominal aorta   and common iliac arteries without significant  atheromatous irregularity, dissection, or stenosis.  The C2 was exchanged for a Sos catheter, used to place a guide wire across the aortic bifurcation.  Over this, the C2 catheter was advanced.  After selective left internal iliac arteriography in multiple projections, an anterior branch of the internal iliac system supplying the region of active extravasation seen on CT was identified and selectively catheterized with the C2 catheter.  Gelfoam slurry embolization was performed.  Follow-up left internal iliac arteriography demonstrates  cessation of flow into the treated segment, no evident complication.  No active extravasation.  In similar fashion, the C2 catheter was directed into   the right internal iliac artery. Selective right pelvic arteriography in multiple projections was obtained.  An anterior branch supplying the region of active extravasation seen on CT was identified. The branch was selectively catheterized and embolization  performed with a Gelfoam slurry.  Follow-up   right internal iliac arteriography demonstrates cessation of flow in the treated segment.  No evidence of active extravasation or other apparent complication.  The catheter was then removed.  After confirmatory right femoral arteriography, the sheath was removed and hemostasis achieved with the Exoseal device. The patient tolerated the procedure well and remained hemodynamically stable.  No immediate complication.  IMPRESSION: 1.  Technically successful Gelfoam embolization of the anterior division left internal iliac artery. 2.  Technically successful Gelfoam embolization of the anterior vision right internal iliac artery.   Original Report Authenticated By: Osa Craver, M.D.    Ir Angiogram Selective Each Additional Vessel  11/18/2011  *RADIOLOGY REPORT*  Clinical Data: 78 year old female with multiple pelvic fractures, pelvic hematoma, evidence of active bilateral extravasation in the anterior pelvis on CT.  She is  currently hemodynamically stable.  PELVIC SELECTIVE ARTERIOGRAPHY,IR ULTRASOUND GUIDANCE VASC ACCESS RIGHT,TRANSCATHETER THERAPY EMBOLIZATION,ARTERIOGRAPHY,ADDITIONAL ARTERIOGRAPHY  Comparison: None.  Approach:  Right common femoral artery  Vessels catheterized:  Anterior branch left internal iliac artery (third order), anterior branch right internal iliac artery (second order)  Technique and findings: The procedure, risks (including but not limited to bleeding, infection, organ damage), benefits, and alternatives were explained to the patient and daughter.  Questions regarding the procedure were encouraged and answered.  The patient understands and consents to the procedure.The right femoral region was prepped and draped usual sterile fashion. Maximal barrier sterile technique was utilized including caps, mask, sterile gowns, sterile gloves, sterile drape, hand hygiene and skin antiseptic.  Intravenous Fentanyl and Versed were administered as conscious sedation during continuous cardiorespiratory monitoring by the radiology RN, with a total moderate sedation time of 120 minutes.  Under real time ultrasound guidance, the right common femoral artery was accessed with a 21-gauge micropuncture needle.  This was exchanged over a 018 guide wire for a transitional dilator, which allowed placement of a Benson wire into the abdominal aorta.  Over this, a 6-French vascular sheath was placed.  Through this, a 5- [place] C2 catheter was advanced in attempts to traverse the aortic bifurcation but because of the relatively acute angulation tortuosity, this was unsuccessful.  Aortography at the level of the bifurcation demonstrates mild tortuosity of the distal abdominal aorta   and common iliac arteries without significant atheromatous irregularity, dissection, or stenosis.  The C2 was exchanged for a Sos catheter, used to place a guide wire across the aortic bifurcation.  Over this, the C2 catheter was advanced.  After selective  left internal iliac arteriography in multiple projections, an anterior branch of the internal iliac system supplying the region of active extravasation seen on CT was identified and selectively catheterized with the C2 catheter.  Gelfoam slurry embolization was performed.  Follow-up left internal iliac arteriography demonstrates  cessation of flow into the treated segment, no evident complication.  No active extravasation.  In similar fashion, the C2 catheter was directed into   the right internal iliac artery. Selective right pelvic arteriography in multiple projections was obtained.  An anterior branch supplying the region of active extravasation seen on CT was identified. The branch was selectively catheterized and embolization  performed with a Gelfoam slurry.  Follow-up   right internal iliac arteriography demonstrates cessation of flow in the treated segment.  No evidence of active extravasation or other apparent complication.  The catheter was then removed.  After confirmatory right femoral arteriography, the sheath was removed and hemostasis achieved with the Exoseal device. The patient tolerated the procedure well and remained hemodynamically stable.  No immediate complication.  IMPRESSION: 1.  Technically successful Gelfoam embolization of the anterior division left internal iliac artery. 2.  Technically successful Gelfoam embolization of the anterior vision right internal iliac artery.   Original Report Authenticated By: Osa Craver, M.D.    Ir Transcath/emboliz  11/18/2011  *RADIOLOGY REPORT*  Clinical Data: 78 year old female with multiple pelvic fractures, pelvic hematoma, evidence of active bilateral extravasation in the anterior pelvis on CT.  She is currently hemodynamically stable.  PELVIC SELECTIVE ARTERIOGRAPHY,IR ULTRASOUND GUIDANCE VASC ACCESS RIGHT,TRANSCATHETER THERAPY EMBOLIZATION,ARTERIOGRAPHY,ADDITIONAL ARTERIOGRAPHY  Comparison: None.  Approach:  Right common femoral artery   Vessels catheterized:  Anterior branch left internal iliac artery (third order), anterior branch right internal iliac artery (second order)  Technique and findings: The procedure, risks (including but not limited to  bleeding, infection, organ damage), benefits, and alternatives were explained to the patient and daughter.  Questions regarding the procedure were encouraged and answered.  The patient understands and consents to the procedure.The right femoral region was prepped and draped usual sterile fashion. Maximal barrier sterile technique was utilized including caps, mask, sterile gowns, sterile gloves, sterile drape, hand hygiene and skin antiseptic.  Intravenous Fentanyl and Versed were administered as conscious sedation during continuous cardiorespiratory monitoring by the radiology RN, with a total moderate sedation time of 120 minutes.  Under real time ultrasound guidance, the right common femoral artery was accessed with a 21-gauge micropuncture needle.  This was exchanged over a 018 guide wire for a transitional dilator, which allowed placement of a Benson wire into the abdominal aorta.  Over this, a 6-French vascular sheath was placed.  Through this, a 5- [place] C2 catheter was advanced in attempts to traverse the aortic bifurcation but because of the relatively acute angulation tortuosity, this was unsuccessful.  Aortography at the level of the bifurcation demonstrates mild tortuosity of the distal abdominal aorta   and common iliac arteries without significant atheromatous irregularity, dissection, or stenosis.  The C2 was exchanged for a Sos catheter, used to place a guide wire across the aortic bifurcation.  Over this, the C2 catheter was advanced.  After selective left internal iliac arteriography in multiple projections, an anterior branch of the internal iliac system supplying the region of active extravasation seen on CT was identified and selectively catheterized with the C2 catheter.  Gelfoam  slurry embolization was performed.  Follow-up left internal iliac arteriography demonstrates  cessation of flow into the treated segment, no evident complication.  No active extravasation.  In similar fashion, the C2 catheter was directed into   the right internal iliac artery. Selective right pelvic arteriography in multiple projections was obtained.  An anterior branch supplying the region of active extravasation seen on CT was identified. The branch was selectively catheterized and embolization  performed with a Gelfoam slurry.  Follow-up   right internal iliac arteriography demonstrates cessation of flow in the treated segment.  No evidence of active extravasation or other apparent complication.  The catheter was then removed.  After confirmatory right femoral arteriography, the sheath was removed and hemostasis achieved with the Exoseal device. The patient tolerated the procedure well and remained hemodynamically stable.  No immediate complication.  IMPRESSION: 1.  Technically successful Gelfoam embolization of the anterior division left internal iliac artery. 2.  Technically successful Gelfoam embolization of the anterior vision right internal iliac artery.   Original Report Authenticated By: Osa Craver, M.D.    Ir Transcath/emboliz  11/18/2011  *RADIOLOGY REPORT*  Clinical Data: 78 year old female with multiple pelvic fractures, pelvic hematoma, evidence of active bilateral extravasation in the anterior pelvis on CT.  She is currently hemodynamically stable.  PELVIC SELECTIVE ARTERIOGRAPHY,IR ULTRASOUND GUIDANCE VASC ACCESS RIGHT,TRANSCATHETER THERAPY EMBOLIZATION,ARTERIOGRAPHY,ADDITIONAL ARTERIOGRAPHY  Comparison: None.  Approach:  Right common femoral artery  Vessels catheterized:  Anterior branch left internal iliac artery (third order), anterior branch right internal iliac artery (second order)  Technique and findings: The procedure, risks (including but not limited to bleeding, infection,  organ damage), benefits, and alternatives were explained to the patient and daughter.  Questions regarding the procedure were encouraged and answered.  The patient understands and consents to the procedure.The right femoral region was prepped and draped usual sterile fashion. Maximal barrier sterile technique was utilized including caps, mask, sterile gowns, sterile gloves, sterile drape, hand hygiene and  skin antiseptic.  Intravenous Fentanyl and Versed were administered as conscious sedation during continuous cardiorespiratory monitoring by the radiology RN, with a total moderate sedation time of 120 minutes.  Under real time ultrasound guidance, the right common femoral artery was accessed with a 21-gauge micropuncture needle.  This was exchanged over a 018 guide wire for a transitional dilator, which allowed placement of a Benson wire into the abdominal aorta.  Over this, a 6-French vascular sheath was placed.  Through this, a 5- [place] C2 catheter was advanced in attempts to traverse the aortic bifurcation but because of the relatively acute angulation tortuosity, this was unsuccessful.  Aortography at the level of the bifurcation demonstrates mild tortuosity of the distal abdominal aorta   and common iliac arteries without significant atheromatous irregularity, dissection, or stenosis.  The C2 was exchanged for a Sos catheter, used to place a guide wire across the aortic bifurcation.  Over this, the C2 catheter was advanced.  After selective left internal iliac arteriography in multiple projections, an anterior branch of the internal iliac system supplying the region of active extravasation seen on CT was identified and selectively catheterized with the C2 catheter.  Gelfoam slurry embolization was performed.  Follow-up left internal iliac arteriography demonstrates  cessation of flow into the treated segment, no evident complication.  No active extravasation.  In similar fashion, the C2 catheter was directed  into   the right internal iliac artery. Selective right pelvic arteriography in multiple projections was obtained.  An anterior branch supplying the region of active extravasation seen on CT was identified. The branch was selectively catheterized and embolization  performed with a Gelfoam slurry.  Follow-up   right internal iliac arteriography demonstrates cessation of flow in the treated segment.  No evidence of active extravasation or other apparent complication.  The catheter was then removed.  After confirmatory right femoral arteriography, the sheath was removed and hemostasis achieved with the Exoseal device. The patient tolerated the procedure well and remained hemodynamically stable.  No immediate complication.  IMPRESSION: 1.  Technically successful Gelfoam embolization of the anterior division left internal iliac artery. 2.  Technically successful Gelfoam embolization of the anterior vision right internal iliac artery.   Original Report Authenticated By: Osa Craver, M.D.    Ir Angiogram Follow Up Study  11/18/2011  *RADIOLOGY REPORT*  Clinical Data: 78 year old female with multiple pelvic fractures, pelvic hematoma, evidence of active bilateral extravasation in the anterior pelvis on CT.  She is currently hemodynamically stable.  PELVIC SELECTIVE ARTERIOGRAPHY,IR ULTRASOUND GUIDANCE VASC ACCESS RIGHT,TRANSCATHETER THERAPY EMBOLIZATION,ARTERIOGRAPHY,ADDITIONAL ARTERIOGRAPHY  Comparison: None.  Approach:  Right common femoral artery  Vessels catheterized:  Anterior branch left internal iliac artery (third order), anterior branch right internal iliac artery (second order)  Technique and findings: The procedure, risks (including but not limited to bleeding, infection, organ damage), benefits, and alternatives were explained to the patient and daughter.  Questions regarding the procedure were encouraged and answered.  The patient understands and consents to the procedure.The right femoral region  was prepped and draped usual sterile fashion. Maximal barrier sterile technique was utilized including caps, mask, sterile gowns, sterile gloves, sterile drape, hand hygiene and skin antiseptic.  Intravenous Fentanyl and Versed were administered as conscious sedation during continuous cardiorespiratory monitoring by the radiology RN, with a total moderate sedation time of 120 minutes.  Under real time ultrasound guidance, the right common femoral artery was accessed with a 21-gauge micropuncture needle.  This was exchanged over a 018 guide wire for  a transitional dilator, which allowed placement of a Benson wire into the abdominal aorta.  Over this, a 6-French vascular sheath was placed.  Through this, a 5- [place] C2 catheter was advanced in attempts to traverse the aortic bifurcation but because of the relatively acute angulation tortuosity, this was unsuccessful.  Aortography at the level of the bifurcation demonstrates mild tortuosity of the distal abdominal aorta   and common iliac arteries without significant atheromatous irregularity, dissection, or stenosis.  The C2 was exchanged for a Sos catheter, used to place a guide wire across the aortic bifurcation.  Over this, the C2 catheter was advanced.  After selective left internal iliac arteriography in multiple projections, an anterior branch of the internal iliac system supplying the region of active extravasation seen on CT was identified and selectively catheterized with the C2 catheter.  Gelfoam slurry embolization was performed.  Follow-up left internal iliac arteriography demonstrates  cessation of flow into the treated segment, no evident complication.  No active extravasation.  In similar fashion, the C2 catheter was directed into   the right internal iliac artery. Selective right pelvic arteriography in multiple projections was obtained.  An anterior branch supplying the region of active extravasation seen on CT was identified. The branch was  selectively catheterized and embolization  performed with a Gelfoam slurry.  Follow-up   right internal iliac arteriography demonstrates cessation of flow in the treated segment.  No evidence of active extravasation or other apparent complication.  The catheter was then removed.  After confirmatory right femoral arteriography, the sheath was removed and hemostasis achieved with the Exoseal device. The patient tolerated the procedure well and remained hemodynamically stable.  No immediate complication.  IMPRESSION: 1.  Technically successful Gelfoam embolization of the anterior division left internal iliac artery. 2.  Technically successful Gelfoam embolization of the anterior vision right internal iliac artery.   Original Report Authenticated By: Osa Craver, M.D.    Ir Angiogram Follow Up Study  11/16/2011  Dayne Oley Balm III, MD     11/16/2011  6:12 PM Bilateral internal iliac artery anterior branch  gelfoam  embolization No complication No blood loss. See complete dictation in [facility].    Ir US Guide Vasc Access Right  11/18/2011  *RADIOLOGY REPORT*  Clinical Data: 78 year old female with multiple pelvic fractures, pelvic hematoma, evidence of active bilateral extravasation in the anterior pelvis on CT.  She is currently hemodynamically stable.  PELVIC SELECTIVE ARTERIOGRAPHY,IR ULTRASOUND GUIDANCE VASC ACCESS RIGHT,TRANSCATHETER THERAPY EMBOLIZATION,ARTERIOGRAPHY,ADDITIONAL ARTERIOGRAPHY  Comparison: None.  Approach:  Right common femoral artery  Vessels catheterized:  Anterior branch left internal iliac artery (third order), anterior branch right internal iliac artery (second order)  Technique and findings: The procedure, risks (including but not limited to bleeding, infection, organ damage), benefits, and alternatives were explained to the patient and daughter.  Questions regarding the procedure were encouraged and answered.  The patient understands and consents to the procedure.The  right femoral region was prepped and draped usual sterile fashion. Maximal barrier sterile technique was utilized including caps, mask, sterile gowns, sterile gloves, sterile drape, hand hygiene and skin antiseptic.  Intravenous Fentanyl and Versed were administered as conscious sedation during continuous cardiorespiratory monitoring by the radiology RN, with a total moderate sedation time of 120 minutes.  Under real time ultrasound guidance, the right common femoral artery was accessed with a 21-gauge micropuncture needle.  This was exchanged over a 018 guide wire for a transitional dilator, which allowed placement of a Benson wire into  the abdominal aorta.  Over this, a 6-French vascular sheath was placed.  Through this, a 5- [place] C2 catheter was advanced in attempts to traverse the aortic bifurcation but because of the relatively acute angulation tortuosity, this was unsuccessful.  Aortography at the level of the bifurcation demonstrates mild tortuosity of the distal abdominal aorta   and common iliac arteries without significant atheromatous irregularity, dissection, or stenosis.  The C2 was exchanged for a Sos catheter, used to place a guide wire across the aortic bifurcation.  Over this, the C2 catheter was advanced.  After selective left internal iliac arteriography in multiple projections, an anterior branch of the internal iliac system supplying the region of active extravasation seen on CT was identified and selectively catheterized with the C2 catheter.  Gelfoam slurry embolization was performed.  Follow-up left internal iliac arteriography demonstrates  cessation of flow into the treated segment, no evident complication.  No active extravasation.  In similar fashion, the C2 catheter was directed into   the right internal iliac artery. Selective right pelvic arteriography in multiple projections was obtained.  An anterior branch supplying the region of active extravasation seen on CT was identified. The  branch was selectively catheterized and embolization  performed with a Gelfoam slurry.  Follow-up   right internal iliac arteriography demonstrates cessation of flow in the treated segment.  No evidence of active extravasation or other apparent complication.  The catheter was then removed.  After confirmatory right femoral arteriography, the sheath was removed and hemostasis achieved with the Exoseal device. The patient tolerated the procedure well and remained hemodynamically stable.  No immediate complication.  IMPRESSION: 1.  Technically successful Gelfoam embolization of the anterior division left internal iliac artery. 2.  Technically successful Gelfoam embolization of the anterior vision right internal iliac artery.   Original Report Authenticated By: Osa Craver, M.D.    Dg Abd Acute W/chest  11/16/2011  *RADIOLOGY REPORT*  Clinical Data: Abdominal distention, abdominal pain  ACUTE ABDOMEN SERIES (ABDOMEN 2 VIEW & CHEST 1 VIEW)  Comparison: Abdominal film 03/11/2008  Findings: Mild enlarged cardiac silhouette with ectatic aorta. There is no free air beneath hemidiaphragms.  Lungs are hyperinflated but clear.  The left lateral decubitus view demonstrates no intraperitoneal free air.  No dilated dilated loops of large or small bowel.  There is gas the rectum.  There is a left hip internal fixation.  There is irregularity along the superior pubic rami on the left.  IMPRESSION:  1.  No acute cardiopulmonary findings. 2.  No evidence of bowel obstruction or intraperitoneal free air. 3.  Concern for left pubic rami fracture.  Recommend dedicated views of the pelvis or CT for further evaluation.   Original Report Authenticated By: Genevive Bi, M.D.     Medications: I have reviewed the patient's current medications. Scheduled Meds:    . docusate sodium  100 mg Oral BID  . feeding supplement  1 Container Oral BID BM  . magnesium sulfate 1 - 4 g bolus IVPB  2 g Intravenous Once   Continuous  Infusions:    . sodium chloride 0.9 % 1,000 mL with potassium chloride 40 mEq infusion 125 mL/hr at 11/20/11 1802   PRN Meds:.acetaminophen, acetaminophen, albuterol, HYDROcodone-acetaminophen, morphine injection, ondansetron (ZOFRAN) IV, ondansetron, polyethylene glycol Assessment/Plan: Patient Active Hospital Problem List: Hyponatremia  Although patient has not had a significant increase in her sodium she's had no further decrease. From a volume standpoint the patient appears more euvolemic today. I suspect that  the patient is intravascularly depleted and needs increased volume to work together pulmonary hypertension for adequate perfusion. I will continue the IV fluids on this patient at this time. The patient is also 48 hours from the use of Lasix as I will get urine studies to evaluate fractional excretion of sodium on this patient. Please note the patient is not on any medications that would contribute to SIADH.  Atrial Fibrillation Pt is now in Atrial Fibrillation with HR in 80's. I suspect that this is related to her acute medical condition and electrolyte derangement.  Will correct electrolytes and reevaluate cardiac rhythm. If she continues to remain in atrial fibrillation, she will likely require cardiology input and  Consideration of anticoagulation (?? In light of hematoma).  Pelvic hematoma, female (11/16/2011) PT is s/p gelfoam embolization  And s/p  Transfusion of 2 Units  PRBC's.  Hb is stable at present. Continue to monitor.  Bilateral pubic rami fractures (11/16/2011) Appreciate Ortho input. PT/OT. OOB as tolerated.   Anemia due to blood loss, acute (11/16/2011) S/PTransfusion of 2 Units  PRBC's.  Hb is stable at present. Continue to monitor.  Hypokalemia Repleted  Leukocytosis  Resolved  HTN (hypertension) BP adequately but not optimally controlled.  Protein Calorie Malnutrition Pt appears chronically malnourished and has had reported poor intake. Will obtain a  nutrition consultation.      LOS: 4 days

## 2011-11-21 LAB — MAGNESIUM: Magnesium: 1.8 mg/dL (ref 1.5–2.5)

## 2011-11-21 LAB — OSMOLALITY, URINE
Osmolality, Ur: 664 mOsm/kg (ref 390–1090)
Osmolality, Ur: 690 mOsm/kg (ref 390–1090)

## 2011-11-21 LAB — OSMOLALITY: Osmolality: 262 mOsm/kg — ABNORMAL LOW (ref 275–300)

## 2011-11-21 LAB — CORTISOL: Cortisol, Plasma: 19.6 ug/dL

## 2011-11-21 MED ORDER — FUROSEMIDE 10 MG/ML IJ SOLN
20.0000 mg | Freq: Every day | INTRAMUSCULAR | Status: DC
  Administered 2011-11-21 – 2011-11-22 (×2): 20 mg via INTRAVENOUS
  Filled 2011-11-21 (×3): qty 2

## 2011-11-21 NOTE — Progress Notes (Signed)
Subjective: PT states that she feels poorly but is unable to articulate symptoms  any further.   Interval history: Pt converted to sinus rhythm yesterday afternoon at approximately 2 PM. Objective: Filed Vitals:   11/20/11 2211 11/20/11 2230 11/21/11 0500 11/21/11 1330  BP: 174/101 151/81 159/76 144/84  Pulse: 86 78 77 104  Temp: 98.1 F (36.7 C)  98.5 F (36.9 C) 98.1 F (36.7 C)  TempSrc: Oral  Oral Oral  Resp: 18  18 18   Height:      Weight:   52.2 kg (115 lb 1.3 oz)   SpO2: 98%  97% 98%   Weight change:   Intake/Output Summary (Last 24 hours) at 11/21/11 1746 Last data filed at 11/21/11 1633  Gross per 24 hour  Intake 3321.42 ml  Output   2625 ml  Net 696.42 ml    General: Alert, awake, oriented x3, in no acute distress. Chronically ill appearing. Today the patient appears to be somewhat volume overloaded HEENT: [place]/AT [place]ER[place], EOMI Neck: Trachea midline,  no masses, no thyromegal,y no JVD, no carotid bruit OROPHARYNX:  Moist, No exudate/ erythema/lesions.  Heart: Regular rate and rhythm, without murmurs, rubs, gallops, PMI non-displaced, no heaves or thrills on palpation.  Lungs: Clear to auscultation, no wheezing or rhonchi noted. No increased vocal fremitus resonant to percussion  Abdomen: Soft, nontender, nondistended, positive bowel sounds, no masses no hepatosplenomegaly noted..  Neuro: No focal neurological deficits noted.  Musculoskeletal: No warm swelling or erythema around joints, no spinal tenderness noted.   Lab Results:  Basename 11/21/11 1046 11/20/11 1604 11/20/11 0440 11/19/11 0507  NA -- 121* 125* --  K -- 4.4 3.7 --  CL -- 93* 94* --  CO2 -- 22 23 --  GLUCOSE -- 124* 105* --  BUN -- 10 11 --  CREATININE -- 0.39* 0.40* --  CALCIUM -- 8.1* 8.3* --  MG 1.8 -- -- 1.6  PHOS -- -- 1.3* --    Basename 11/20/11 0440  AST --  ALT --  ALKPHOS --  BILITOT --  PROT --  ALBUMIN 2.4*   No results found for this basename: LIPASE:2,AMYLASE:2 in the  last 72 hours  Basename 11/19/11 0507 11/19/11 0005  WBC 9.6 11.0*  NEUTROABS -- --  HGB 10.9* 10.8*  HCT 30.7* 29.9*  MCV 85.0 84.5  PLT 115* 111*   No results found for this basename: CKTOTAL:3,CKMB:3,CKMBINDEX:3,TROPONINI:3 in the last 72 hours No components found with this basename: POCBNP:3 No results found for this basename: DDIMER:2 in the last 72 hours No results found for this basename: HGBA1C:2 in the last 72 hours No results found for this basename: CHOL:2,HDL:2,LDLCALC:2,TRIG:2,CHOLHDL:2,LDLDIRECT:2 in the last 72 hours  Basename 11/21/11 1046  TSH 3.826  T4TOTAL --  T3FREE --  THYROIDAB --   No results found for this basename: VITAMINB12:2,FOLATE:2,FERRITIN:2,TIBC:2,IRON:2,RETICCTPCT:2 in the last 72 hours  Micro Results: Recent Results (from the past 240 hour(s))  MRSA PCR SCREENING     Status: Normal   Collection Time   11/16/11  9:29 PM      Component Value Range Status Comment   MRSA by PCR NEGATIVE  NEGATIVE Final     Studies/Results: Ct Head Wo Contrast  11/16/2011  *RADIOLOGY REPORT*  Clinical Data:  78 year old female with headache and neck pain following fall.  CT HEAD WITHOUT CONTRAST CT CERVICAL SPINE WITHOUT CONTRAST  Technique:  Multidetector CT imaging of the head and cervical spine was performed following the standard protocol without intravenous contrast.  Multiplanar CT  image reconstructions of the cervical spine were also generated.  Comparison:  05/22/2005.  CT HEAD  Findings: Atrophy and chronic small vessel white matter ischemic changes are again identified.  No acute intracranial abnormalities are identified, including mass lesion or mass effect, hydrocephalus, extra-axial fluid collection, midline shift, hemorrhage, or acute infarction.  The visualized bony calvarium is unremarkable.  IMPRESSION: No evidence of acute intracranial abnormality.  Atrophy and chronic small vessel white matter ischemic changes.  CT CERVICAL SPINE  Findings: 1.5 mm  retrolisthesis of C5 in relation to C4-C6 is identified, almost certainly degenerative. There is no evidence of acute fracture or prevertebral soft tissue swelling. Diffuse osteopenia and moderate facet arthropathy throughout the cervical spine noted. Severe degenerative disc disease at C4-C5 an C5-C6 noted. No focal bony lesions are present. No soft tissue abnormalities are noted.  IMPRESSION: No static evidence of acute injury to the cervical spine.  Mild retrolisthesis of C5 with severe degenerative changes at C4-C5 and C5-C6.  Diffuse osteopenia.   Original Report Authenticated By: Rosendo Gros, M.D.    Ct Cervical Spine Wo Contrast  11/16/2011  *RADIOLOGY REPORT*  Clinical Data:  78 year old female with headache and neck pain following fall.  CT HEAD WITHOUT CONTRAST CT CERVICAL SPINE WITHOUT CONTRAST  Technique:  Multidetector CT imaging of the head and cervical spine was performed following the standard protocol without intravenous contrast.  Multiplanar CT image reconstructions of the cervical spine were also generated.  Comparison:  05/22/2005.  CT HEAD  Findings: Atrophy and chronic small vessel white matter ischemic changes are again identified.  No acute intracranial abnormalities are identified, including mass lesion or mass effect, hydrocephalus, extra-axial fluid collection, midline shift, hemorrhage, or acute infarction.  The visualized bony calvarium is unremarkable.  IMPRESSION: No evidence of acute intracranial abnormality.  Atrophy and chronic small vessel white matter ischemic changes.  CT CERVICAL SPINE  Findings: 1.5 mm retrolisthesis of C5 in relation to C4-C6 is identified, almost certainly degenerative. There is no evidence of acute fracture or prevertebral soft tissue swelling. Diffuse osteopenia and moderate facet arthropathy throughout the cervical spine noted. Severe degenerative disc disease at C4-C5 an C5-C6 noted. No focal bony lesions are present. No soft tissue abnormalities  are noted.  IMPRESSION: No static evidence of acute injury to the cervical spine.  Mild retrolisthesis of C5 with severe degenerative changes at C4-C5 and C5-C6.  Diffuse osteopenia.   Original Report Authenticated By: Rosendo Gros, M.D.    Ct Abdomen Pelvis W Contrast  11/16/2011  *RADIOLOGY REPORT*  Clinical Data: Pain post fall.  Possible pelvic fracture.  CT ABDOMEN AND PELVIS WITH CONTRAST  Technique:  Multidetector CT imaging of the abdomen and pelvis was performed following the standard protocol during bolus administration of intravenous contrast.  Contrast: OMNIPAQUE IOHEXOL 300 MG/ML  SOLN  Comparison: None.  Findings: Linear scarring or subsegmental atelectasis posteriorly in the visualized lung bases.  Calcified granuloma in the inferior lingula.  Sub centimeter subcapsular probable cyst in the medial left hepatic segment.  Otherwise unremarkable liver, gallbladder, spleen, adrenal glands, right kidney.  There is a small probable cyst in the upper pole left kidney.  No hydronephrosis.  Stomach and small bowel are nondistended.  Moderate fecal material in the colon. No definite ascites. No free air.  Minimally-displaced fractures of bilateral superior and inferior ischiopubic rami, comminuted and displaced on the left.  There is a moderately large prevesical, extraperitoneal, left retroperitoneal/presacral and anterior body wall hematoma.  There are at  least three focal areas of probable active extravasation noted, two just cephalad to the left superior ischiopubic ramus, the third  posterior to the right superior pubic ramus.  There is a minimally-displaced fracture through the left sacral ala without definite involvement of the sacral foramina.  Fixation hardware across the left femoral neck is partially seen. Progressive vertebral compression fracture deformities of T11, T12, L1, L2, and L4 since previous films of 03/11/2008.  There is mild retropulsion into the spinal canal at T11, T12, L1,  and L2.  No displaced posterior element fractures evident.  IMPRESSION:  1.  Acute fractures of superior and inferior ischiopubic rami, with a moderately large pelvic hematoma and evidence of bilateral active extravasation. I telephoned the critical test results to Dr. Fredderick Phenix at the time of interpretation. 2.  Minimally-displaced fracture of the left sacral ala. 3.  Multiple thoracolumbar compression fracture deformities, progressive since 03/11/2008.   Original Report Authenticated By: Osa Craver, M.D.    Ir Angiogram Pelvis Selective Or Supraselective  11/18/2011  *RADIOLOGY REPORT*  Clinical Data: 78 year old female with multiple pelvic fractures, pelvic hematoma, evidence of active bilateral extravasation in the anterior pelvis on CT.  She is currently hemodynamically stable.  PELVIC SELECTIVE ARTERIOGRAPHY,IR ULTRASOUND GUIDANCE VASC ACCESS RIGHT,TRANSCATHETER THERAPY EMBOLIZATION,ARTERIOGRAPHY,ADDITIONAL ARTERIOGRAPHY  Comparison: None.  Approach:  Right common femoral artery  Vessels catheterized:  Anterior branch left internal iliac artery (third order), anterior branch right internal iliac artery (second order)  Technique and findings: The procedure, risks (including but not limited to bleeding, infection, organ damage), benefits, and alternatives were explained to the patient and daughter.  Questions regarding the procedure were encouraged and answered.  The patient understands and consents to the procedure.The right femoral region was prepped and draped usual sterile fashion. Maximal barrier sterile technique was utilized including caps, mask, sterile gowns, sterile gloves, sterile drape, hand hygiene and skin antiseptic.  Intravenous Fentanyl and Versed were administered as conscious sedation during continuous cardiorespiratory monitoring by the radiology RN, with a total moderate sedation time of 120 minutes.  Under real time ultrasound guidance, the right common femoral artery was  accessed with a 21-gauge micropuncture needle.  This was exchanged over a 018 guide wire for a transitional dilator, which allowed placement of a Benson wire into the abdominal aorta.  Over this, a 6-French vascular sheath was placed.  Through this, a 5- [place] C2 catheter was advanced in attempts to traverse the aortic bifurcation but because of the relatively acute angulation tortuosity, this was unsuccessful.  Aortography at the level of the bifurcation demonstrates mild tortuosity of the distal abdominal aorta   and common iliac arteries without significant atheromatous irregularity, dissection, or stenosis.  The C2 was exchanged for a Sos catheter, used to place a guide wire across the aortic bifurcation.  Over this, the C2 catheter was advanced.  After selective left internal iliac arteriography in multiple projections, an anterior branch of the internal iliac system supplying the region of active extravasation seen on CT was identified and selectively catheterized with the C2 catheter.  Gelfoam slurry embolization was performed.  Follow-up left internal iliac arteriography demonstrates  cessation of flow into the treated segment, no evident complication.  No active extravasation.  In similar fashion, the C2 catheter was directed into   the right internal iliac artery. Selective right pelvic arteriography in multiple projections was obtained.  An anterior branch supplying the region of active extravasation seen on CT was identified. The branch was selectively catheterized and embolization  performed with a Gelfoam slurry.  Follow-up   right internal iliac arteriography demonstrates cessation of flow in the treated segment.  No evidence of active extravasation or other apparent complication.  The catheter was then removed.  After confirmatory right femoral arteriography, the sheath was removed and hemostasis achieved with the Exoseal device. The patient tolerated the procedure well and remained hemodynamically  stable.  No immediate complication.  IMPRESSION: 1.  Technically successful Gelfoam embolization of the anterior division left internal iliac artery. 2.  Technically successful Gelfoam embolization of the anterior vision right internal iliac artery.   Original Report Authenticated By: Osa Craver, M.D.    Ir Angiogram Pelvis Selective Or Supraselective  11/18/2011  *RADIOLOGY REPORT*  Clinical Data: 78 year old female with multiple pelvic fractures, pelvic hematoma, evidence of active bilateral extravasation in the anterior pelvis on CT.  She is currently hemodynamically stable.  PELVIC SELECTIVE ARTERIOGRAPHY,IR ULTRASOUND GUIDANCE VASC ACCESS RIGHT,TRANSCATHETER THERAPY EMBOLIZATION,ARTERIOGRAPHY,ADDITIONAL ARTERIOGRAPHY  Comparison: None.  Approach:  Right common femoral artery  Vessels catheterized:  Anterior branch left internal iliac artery (third order), anterior branch right internal iliac artery (second order)  Technique and findings: The procedure, risks (including but not limited to bleeding, infection, organ damage), benefits, and alternatives were explained to the patient and daughter.  Questions regarding the procedure were encouraged and answered.  The patient understands and consents to the procedure.The right femoral region was prepped and draped usual sterile fashion. Maximal barrier sterile technique was utilized including caps, mask, sterile gowns, sterile gloves, sterile drape, hand hygiene and skin antiseptic.  Intravenous Fentanyl and Versed were administered as conscious sedation during continuous cardiorespiratory monitoring by the radiology RN, with a total moderate sedation time of 120 minutes.  Under real time ultrasound guidance, the right common femoral artery was accessed with a 21-gauge micropuncture needle.  This was exchanged over a 018 guide wire for a transitional dilator, which allowed placement of a Benson wire into the abdominal aorta.  Over this, a 6-French  vascular sheath was placed.  Through this, a 5- [place] C2 catheter was advanced in attempts to traverse the aortic bifurcation but because of the relatively acute angulation tortuosity, this was unsuccessful.  Aortography at the level of the bifurcation demonstrates mild tortuosity of the distal abdominal aorta   and common iliac arteries without significant atheromatous irregularity, dissection, or stenosis.  The C2 was exchanged for a Sos catheter, used to place a guide wire across the aortic bifurcation.  Over this, the C2 catheter was advanced.  After selective left internal iliac arteriography in multiple projections, an anterior branch of the internal iliac system supplying the region of active extravasation seen on CT was identified and selectively catheterized with the C2 catheter.  Gelfoam slurry embolization was performed.  Follow-up left internal iliac arteriography demonstrates  cessation of flow into the treated segment, no evident complication.  No active extravasation.  In similar fashion, the C2 catheter was directed into   the right internal iliac artery. Selective right pelvic arteriography in multiple projections was obtained.  An anterior branch supplying the region of active extravasation seen on CT was identified. The branch was selectively catheterized and embolization  performed with a Gelfoam slurry.  Follow-up   right internal iliac arteriography demonstrates cessation of flow in the treated segment.  No evidence of active extravasation or other apparent complication.  The catheter was then removed.  After confirmatory right femoral arteriography, the sheath was removed and hemostasis achieved with the Exoseal device. The patient  tolerated the procedure well and remained hemodynamically stable.  No immediate complication.  IMPRESSION: 1.  Technically successful Gelfoam embolization of the anterior division left internal iliac artery. 2.  Technically successful Gelfoam embolization of the  anterior vision right internal iliac artery.   Original Report Authenticated By: Osa Craver, M.D.    Ir Angiogram Selective Each Additional Vessel  11/18/2011  *RADIOLOGY REPORT*  Clinical Data: 78 year old female with multiple pelvic fractures, pelvic hematoma, evidence of active bilateral extravasation in the anterior pelvis on CT.  She is currently hemodynamically stable.  PELVIC SELECTIVE ARTERIOGRAPHY,IR ULTRASOUND GUIDANCE VASC ACCESS RIGHT,TRANSCATHETER THERAPY EMBOLIZATION,ARTERIOGRAPHY,ADDITIONAL ARTERIOGRAPHY  Comparison: None.  Approach:  Right common femoral artery  Vessels catheterized:  Anterior branch left internal iliac artery (third order), anterior branch right internal iliac artery (second order)  Technique and findings: The procedure, risks (including but not limited to bleeding, infection, organ damage), benefits, and alternatives were explained to the patient and daughter.  Questions regarding the procedure were encouraged and answered.  The patient understands and consents to the procedure.The right femoral region was prepped and draped usual sterile fashion. Maximal barrier sterile technique was utilized including caps, mask, sterile gowns, sterile gloves, sterile drape, hand hygiene and skin antiseptic.  Intravenous Fentanyl and Versed were administered as conscious sedation during continuous cardiorespiratory monitoring by the radiology RN, with a total moderate sedation time of 120 minutes.  Under real time ultrasound guidance, the right common femoral artery was accessed with a 21-gauge micropuncture needle.  This was exchanged over a 018 guide wire for a transitional dilator, which allowed placement of a Benson wire into the abdominal aorta.  Over this, a 6-French vascular sheath was placed.  Through this, a 5- [place] C2 catheter was advanced in attempts to traverse the aortic bifurcation but because of the relatively acute angulation tortuosity, this was unsuccessful.   Aortography at the level of the bifurcation demonstrates mild tortuosity of the distal abdominal aorta   and common iliac arteries without significant atheromatous irregularity, dissection, or stenosis.  The C2 was exchanged for a Sos catheter, used to place a guide wire across the aortic bifurcation.  Over this, the C2 catheter was advanced.  After selective left internal iliac arteriography in multiple projections, an anterior branch of the internal iliac system supplying the region of active extravasation seen on CT was identified and selectively catheterized with the C2 catheter.  Gelfoam slurry embolization was performed.  Follow-up left internal iliac arteriography demonstrates  cessation of flow into the treated segment, no evident complication.  No active extravasation.  In similar fashion, the C2 catheter was directed into   the right internal iliac artery. Selective right pelvic arteriography in multiple projections was obtained.  An anterior branch supplying the region of active extravasation seen on CT was identified. The branch was selectively catheterized and embolization  performed with a Gelfoam slurry.  Follow-up   right internal iliac arteriography demonstrates cessation of flow in the treated segment.  No evidence of active extravasation or other apparent complication.  The catheter was then removed.  After confirmatory right femoral arteriography, the sheath was removed and hemostasis achieved with the Exoseal device. The patient tolerated the procedure well and remained hemodynamically stable.  No immediate complication.  IMPRESSION: 1.  Technically successful Gelfoam embolization of the anterior division left internal iliac artery. 2.  Technically successful Gelfoam embolization of the anterior vision right internal iliac artery.   Original Report Authenticated By: Osa Craver, M.D.  Ir Angiogram Selective Each Additional Vessel  11/18/2011  *RADIOLOGY REPORT*  Clinical Data:  78 year old female with multiple pelvic fractures, pelvic hematoma, evidence of active bilateral extravasation in the anterior pelvis on CT.  She is currently hemodynamically stable.  PELVIC SELECTIVE ARTERIOGRAPHY,IR ULTRASOUND GUIDANCE VASC ACCESS RIGHT,TRANSCATHETER THERAPY EMBOLIZATION,ARTERIOGRAPHY,ADDITIONAL ARTERIOGRAPHY  Comparison: None.  Approach:  Right common femoral artery  Vessels catheterized:  Anterior branch left internal iliac artery (third order), anterior branch right internal iliac artery (second order)  Technique and findings: The procedure, risks (including but not limited to bleeding, infection, organ damage), benefits, and alternatives were explained to the patient and daughter.  Questions regarding the procedure were encouraged and answered.  The patient understands and consents to the procedure.The right femoral region was prepped and draped usual sterile fashion. Maximal barrier sterile technique was utilized including caps, mask, sterile gowns, sterile gloves, sterile drape, hand hygiene and skin antiseptic.  Intravenous Fentanyl and Versed were administered as conscious sedation during continuous cardiorespiratory monitoring by the radiology RN, with a total moderate sedation time of 120 minutes.  Under real time ultrasound guidance, the right common femoral artery was accessed with a 21-gauge micropuncture needle.  This was exchanged over a 018 guide wire for a transitional dilator, which allowed placement of a Benson wire into the abdominal aorta.  Over this, a 6-French vascular sheath was placed.  Through this, a 5- [place] C2 catheter was advanced in attempts to traverse the aortic bifurcation but because of the relatively acute angulation tortuosity, this was unsuccessful.  Aortography at the level of the bifurcation demonstrates mild tortuosity of the distal abdominal aorta   and common iliac arteries without significant atheromatous irregularity, dissection, or stenosis.  The C2  was exchanged for a Sos catheter, used to place a guide wire across the aortic bifurcation.  Over this, the C2 catheter was advanced.  After selective left internal iliac arteriography in multiple projections, an anterior branch of the internal iliac system supplying the region of active extravasation seen on CT was identified and selectively catheterized with the C2 catheter.  Gelfoam slurry embolization was performed.  Follow-up left internal iliac arteriography demonstrates  cessation of flow into the treated segment, no evident complication.  No active extravasation.  In similar fashion, the C2 catheter was directed into   the right internal iliac artery. Selective right pelvic arteriography in multiple projections was obtained.  An anterior branch supplying the region of active extravasation seen on CT was identified. The branch was selectively catheterized and embolization  performed with a Gelfoam slurry.  Follow-up   right internal iliac arteriography demonstrates cessation of flow in the treated segment.  No evidence of active extravasation or other apparent complication.  The catheter was then removed.  After confirmatory right femoral arteriography, the sheath was removed and hemostasis achieved with the Exoseal device. The patient tolerated the procedure well and remained hemodynamically stable.  No immediate complication.  IMPRESSION: 1.  Technically successful Gelfoam embolization of the anterior division left internal iliac artery. 2.  Technically successful Gelfoam embolization of the anterior vision right internal iliac artery.   Original Report Authenticated By: Osa Craver, M.D.    Ir Transcath/emboliz  11/18/2011  *RADIOLOGY REPORT*  Clinical Data: 78 year old female with multiple pelvic fractures, pelvic hematoma, evidence of active bilateral extravasation in the anterior pelvis on CT.  She is currently hemodynamically stable.  PELVIC SELECTIVE ARTERIOGRAPHY,IR ULTRASOUND GUIDANCE  VASC ACCESS RIGHT,TRANSCATHETER THERAPY EMBOLIZATION,ARTERIOGRAPHY,ADDITIONAL ARTERIOGRAPHY  Comparison: None.  Approach:  Right common  femoral artery  Vessels catheterized:  Anterior branch left internal iliac artery (third order), anterior branch right internal iliac artery (second order)  Technique and findings: The procedure, risks (including but not limited to bleeding, infection, organ damage), benefits, and alternatives were explained to the patient and daughter.  Questions regarding the procedure were encouraged and answered.  The patient understands and consents to the procedure.The right femoral region was prepped and draped usual sterile fashion. Maximal barrier sterile technique was utilized including caps, mask, sterile gowns, sterile gloves, sterile drape, hand hygiene and skin antiseptic.  Intravenous Fentanyl and Versed were administered as conscious sedation during continuous cardiorespiratory monitoring by the radiology RN, with a total moderate sedation time of 120 minutes.  Under real time ultrasound guidance, the right common femoral artery was accessed with a 21-gauge micropuncture needle.  This was exchanged over a 018 guide wire for a transitional dilator, which allowed placement of a Benson wire into the abdominal aorta.  Over this, a 6-French vascular sheath was placed.  Through this, a 5- [place] C2 catheter was advanced in attempts to traverse the aortic bifurcation but because of the relatively acute angulation tortuosity, this was unsuccessful.  Aortography at the level of the bifurcation demonstrates mild tortuosity of the distal abdominal aorta   and common iliac arteries without significant atheromatous irregularity, dissection, or stenosis.  The C2 was exchanged for a Sos catheter, used to place a guide wire across the aortic bifurcation.  Over this, the C2 catheter was advanced.  After selective left internal iliac arteriography in multiple projections, an anterior branch of the  internal iliac system supplying the region of active extravasation seen on CT was identified and selectively catheterized with the C2 catheter.  Gelfoam slurry embolization was performed.  Follow-up left internal iliac arteriography demonstrates  cessation of flow into the treated segment, no evident complication.  No active extravasation.  In similar fashion, the C2 catheter was directed into   the right internal iliac artery. Selective right pelvic arteriography in multiple projections was obtained.  An anterior branch supplying the region of active extravasation seen on CT was identified. The branch was selectively catheterized and embolization  performed with a Gelfoam slurry.  Follow-up   right internal iliac arteriography demonstrates cessation of flow in the treated segment.  No evidence of active extravasation or other apparent complication.  The catheter was then removed.  After confirmatory right femoral arteriography, the sheath was removed and hemostasis achieved with the Exoseal device. The patient tolerated the procedure well and remained hemodynamically stable.  No immediate complication.  IMPRESSION: 1.  Technically successful Gelfoam embolization of the anterior division left internal iliac artery. 2.  Technically successful Gelfoam embolization of the anterior vision right internal iliac artery.   Original Report Authenticated By: Osa Craver, M.D.    Ir Transcath/emboliz  11/18/2011  *RADIOLOGY REPORT*  Clinical Data: 78 year old female with multiple pelvic fractures, pelvic hematoma, evidence of active bilateral extravasation in the anterior pelvis on CT.  She is currently hemodynamically stable.  PELVIC SELECTIVE ARTERIOGRAPHY,IR ULTRASOUND GUIDANCE VASC ACCESS RIGHT,TRANSCATHETER THERAPY EMBOLIZATION,ARTERIOGRAPHY,ADDITIONAL ARTERIOGRAPHY  Comparison: None.  Approach:  Right common femoral artery  Vessels catheterized:  Anterior branch left internal iliac artery (third order),  anterior branch right internal iliac artery (second order)  Technique and findings: The procedure, risks (including but not limited to bleeding, infection, organ damage), benefits, and alternatives were explained to the patient and daughter.  Questions regarding the procedure were encouraged and answered.  The patient  understands and consents to the procedure.The right femoral region was prepped and draped usual sterile fashion. Maximal barrier sterile technique was utilized including caps, mask, sterile gowns, sterile gloves, sterile drape, hand hygiene and skin antiseptic.  Intravenous Fentanyl and Versed were administered as conscious sedation during continuous cardiorespiratory monitoring by the radiology RN, with a total moderate sedation time of 120 minutes.  Under real time ultrasound guidance, the right common femoral artery was accessed with a 21-gauge micropuncture needle.  This was exchanged over a 018 guide wire for a transitional dilator, which allowed placement of a Benson wire into the abdominal aorta.  Over this, a 6-French vascular sheath was placed.  Through this, a 5- [place] C2 catheter was advanced in attempts to traverse the aortic bifurcation but because of the relatively acute angulation tortuosity, this was unsuccessful.  Aortography at the level of the bifurcation demonstrates mild tortuosity of the distal abdominal aorta   and common iliac arteries without significant atheromatous irregularity, dissection, or stenosis.  The C2 was exchanged for a Sos catheter, used to place a guide wire across the aortic bifurcation.  Over this, the C2 catheter was advanced.  After selective left internal iliac arteriography in multiple projections, an anterior branch of the internal iliac system supplying the region of active extravasation seen on CT was identified and selectively catheterized with the C2 catheter.  Gelfoam slurry embolization was performed.  Follow-up left internal iliac arteriography  demonstrates  cessation of flow into the treated segment, no evident complication.  No active extravasation.  In similar fashion, the C2 catheter was directed into   the right internal iliac artery. Selective right pelvic arteriography in multiple projections was obtained.  An anterior branch supplying the region of active extravasation seen on CT was identified. The branch was selectively catheterized and embolization  performed with a Gelfoam slurry.  Follow-up   right internal iliac arteriography demonstrates cessation of flow in the treated segment.  No evidence of active extravasation or other apparent complication.  The catheter was then removed.  After confirmatory right femoral arteriography, the sheath was removed and hemostasis achieved with the Exoseal device. The patient tolerated the procedure well and remained hemodynamically stable.  No immediate complication.  IMPRESSION: 1.  Technically successful Gelfoam embolization of the anterior division left internal iliac artery. 2.  Technically successful Gelfoam embolization of the anterior vision right internal iliac artery.   Original Report Authenticated By: Osa Craver, M.D.    Ir Angiogram Follow Up Study  11/18/2011  *RADIOLOGY REPORT*  Clinical Data: 78 year old female with multiple pelvic fractures, pelvic hematoma, evidence of active bilateral extravasation in the anterior pelvis on CT.  She is currently hemodynamically stable.  PELVIC SELECTIVE ARTERIOGRAPHY,IR ULTRASOUND GUIDANCE VASC ACCESS RIGHT,TRANSCATHETER THERAPY EMBOLIZATION,ARTERIOGRAPHY,ADDITIONAL ARTERIOGRAPHY  Comparison: None.  Approach:  Right common femoral artery  Vessels catheterized:  Anterior branch left internal iliac artery (third order), anterior branch right internal iliac artery (second order)  Technique and findings: The procedure, risks (including but not limited to bleeding, infection, organ damage), benefits, and alternatives were explained to the patient  and daughter.  Questions regarding the procedure were encouraged and answered.  The patient understands and consents to the procedure.The right femoral region was prepped and draped usual sterile fashion. Maximal barrier sterile technique was utilized including caps, mask, sterile gowns, sterile gloves, sterile drape, hand hygiene and skin antiseptic.  Intravenous Fentanyl and Versed were administered as conscious sedation during continuous cardiorespiratory monitoring by the radiology RN, with a total  moderate sedation time of 120 minutes.  Under real time ultrasound guidance, the right common femoral artery was accessed with a 21-gauge micropuncture needle.  This was exchanged over a 018 guide wire for a transitional dilator, which allowed placement of a Benson wire into the abdominal aorta.  Over this, a 6-French vascular sheath was placed.  Through this, a 5- [place] C2 catheter was advanced in attempts to traverse the aortic bifurcation but because of the relatively acute angulation tortuosity, this was unsuccessful.  Aortography at the level of the bifurcation demonstrates mild tortuosity of the distal abdominal aorta   and common iliac arteries without significant atheromatous irregularity, dissection, or stenosis.  The C2 was exchanged for a Sos catheter, used to place a guide wire across the aortic bifurcation.  Over this, the C2 catheter was advanced.  After selective left internal iliac arteriography in multiple projections, an anterior branch of the internal iliac system supplying the region of active extravasation seen on CT was identified and selectively catheterized with the C2 catheter.  Gelfoam slurry embolization was performed.  Follow-up left internal iliac arteriography demonstrates  cessation of flow into the treated segment, no evident complication.  No active extravasation.  In similar fashion, the C2 catheter was directed into   the right internal iliac artery. Selective right pelvic  arteriography in multiple projections was obtained.  An anterior branch supplying the region of active extravasation seen on CT was identified. The branch was selectively catheterized and embolization  performed with a Gelfoam slurry.  Follow-up   right internal iliac arteriography demonstrates cessation of flow in the treated segment.  No evidence of active extravasation or other apparent complication.  The catheter was then removed.  After confirmatory right femoral arteriography, the sheath was removed and hemostasis achieved with the Exoseal device. The patient tolerated the procedure well and remained hemodynamically stable.  No immediate complication.  IMPRESSION: 1.  Technically successful Gelfoam embolization of the anterior division left internal iliac artery. 2.  Technically successful Gelfoam embolization of the anterior vision right internal iliac artery.   Original Report Authenticated By: Osa Craver, M.D.    Ir Angiogram Follow Up Study  11/16/2011  Dayne Oley Balm III, MD     11/16/2011  6:12 PM Bilateral internal iliac artery anterior branch  gelfoam  embolization No complication No blood loss. See complete dictation in [facility].    Ir US Guide Vasc Access Right  11/18/2011  *RADIOLOGY REPORT*  Clinical Data: 78 year old female with multiple pelvic fractures, pelvic hematoma, evidence of active bilateral extravasation in the anterior pelvis on CT.  She is currently hemodynamically stable.  PELVIC SELECTIVE ARTERIOGRAPHY,IR ULTRASOUND GUIDANCE VASC ACCESS RIGHT,TRANSCATHETER THERAPY EMBOLIZATION,ARTERIOGRAPHY,ADDITIONAL ARTERIOGRAPHY  Comparison: None.  Approach:  Right common femoral artery  Vessels catheterized:  Anterior branch left internal iliac artery (third order), anterior branch right internal iliac artery (second order)  Technique and findings: The procedure, risks (including but not limited to bleeding, infection, organ damage), benefits, and alternatives were  explained to the patient and daughter.  Questions regarding the procedure were encouraged and answered.  The patient understands and consents to the procedure.The right femoral region was prepped and draped usual sterile fashion. Maximal barrier sterile technique was utilized including caps, mask, sterile gowns, sterile gloves, sterile drape, hand hygiene and skin antiseptic.  Intravenous Fentanyl and Versed were administered as conscious sedation during continuous cardiorespiratory monitoring by the radiology RN, with a total moderate sedation time of 120 minutes.  Under real time ultrasound  guidance, the right common femoral artery was accessed with a 21-gauge micropuncture needle.  This was exchanged over a 018 guide wire for a transitional dilator, which allowed placement of a Benson wire into the abdominal aorta.  Over this, a 6-French vascular sheath was placed.  Through this, a 5- [place] C2 catheter was advanced in attempts to traverse the aortic bifurcation but because of the relatively acute angulation tortuosity, this was unsuccessful.  Aortography at the level of the bifurcation demonstrates mild tortuosity of the distal abdominal aorta   and common iliac arteries without significant atheromatous irregularity, dissection, or stenosis.  The C2 was exchanged for a Sos catheter, used to place a guide wire across the aortic bifurcation.  Over this, the C2 catheter was advanced.  After selective left internal iliac arteriography in multiple projections, an anterior branch of the internal iliac system supplying the region of active extravasation seen on CT was identified and selectively catheterized with the C2 catheter.  Gelfoam slurry embolization was performed.  Follow-up left internal iliac arteriography demonstrates  cessation of flow into the treated segment, no evident complication.  No active extravasation.  In similar fashion, the C2 catheter was directed into   the right internal iliac artery.  Selective right pelvic arteriography in multiple projections was obtained.  An anterior branch supplying the region of active extravasation seen on CT was identified. The branch was selectively catheterized and embolization  performed with a Gelfoam slurry.  Follow-up   right internal iliac arteriography demonstrates cessation of flow in the treated segment.  No evidence of active extravasation or other apparent complication.  The catheter was then removed.  After confirmatory right femoral arteriography, the sheath was removed and hemostasis achieved with the Exoseal device. The patient tolerated the procedure well and remained hemodynamically stable.  No immediate complication.  IMPRESSION: 1.  Technically successful Gelfoam embolization of the anterior division left internal iliac artery. 2.  Technically successful Gelfoam embolization of the anterior vision right internal iliac artery.   Original Report Authenticated By: Osa Craver, M.D.    Dg Abd Acute W/chest  11/16/2011  *RADIOLOGY REPORT*  Clinical Data: Abdominal distention, abdominal pain  ACUTE ABDOMEN SERIES (ABDOMEN 2 VIEW & CHEST 1 VIEW)  Comparison: Abdominal film 03/11/2008  Findings: Mild enlarged cardiac silhouette with ectatic aorta. There is no free air beneath hemidiaphragms.  Lungs are hyperinflated but clear.  The left lateral decubitus view demonstrates no intraperitoneal free air.  No dilated dilated loops of large or small bowel.  There is gas the rectum.  There is a left hip internal fixation.  There is irregularity along the superior pubic rami on the left.  IMPRESSION:  1.  No acute cardiopulmonary findings. 2.  No evidence of bowel obstruction or intraperitoneal free air. 3.  Concern for left pubic rami fracture.  Recommend dedicated views of the pelvis or CT for further evaluation.   Original Report Authenticated By: Genevive Bi, M.D.     Medications: I have reviewed the patient's current medications. Scheduled  Meds:    . docusate sodium  100 mg Oral BID  . feeding supplement  1 Container Oral BID BM  . furosemide  20 mg Intravenous Daily   Continuous Infusions:    . sodium chloride 0.9 % 1,000 mL with potassium chloride 40 mEq infusion 10 mL/hr at 11/21/11 1131   PRN Meds:.acetaminophen, acetaminophen, albuterol, HYDROcodone-acetaminophen, morphine injection, ondansetron (ZOFRAN) IV, ondansetron, polyethylene glycol Assessment/Plan: Patient Active Hospital Problem List: Hyponatremia  Pt now  appears somewhat volume overloaded. Will start the patient on Lasix. I appreciate input from nephrology and agree with diuresis.  Pelvic hematoma, female (11/16/2011) PT is s/p gelfoam embolization  And s/p  Transfusion of 2 Units  PRBC's.  Hb is stable at present. Continue to monitor.  Bilateral pubic rami fractures (11/16/2011) Appreciate Ortho input. PT/OT. OOB as tolerated.   Anemia due to blood loss, acute (11/16/2011) S/PTransfusion of 2 Units  PRBC's.  Hb is stable at present. Continue to monitor.  Atrial Fibrillation Pt has converted back to sinus rhythm.  Hypokalemia:  Corrected   Leukocytosis  Resolved  HTN (hypertension) BP adequately but not optimally controlled.  Protein Calorie Malnutrition Pt appears chronically malnourished and has had reported poor intake. Appreciate input from nutrition.      LOS: 5 days

## 2011-11-21 NOTE — Consult Note (Signed)
Reason for Consult:hyponatremia Referring Physician: [name] is an 78 y.o. female.  HPI: Pt is a 78 yo WF with PMH sig for HTN, MVR, pulm HTN, and osteoporosis who fell and has bilateral pubic rami fx with hematoma.  Pt was admitted for conservative care.  We were asked to see the patient due to worsening hyponatremia.  Pt was initially felt to be hypovolemic and Lasix was stopped and started on IV NS.  Her trend in creatinine is seen below.  Of note, she was on an ACE Inhibitor but this was held.   Trends in Serum Sodium: Sodium  Date/Time Value Range Status  11/20/2011  4:04 PM 121* 135 - 145 mEq/L Final  11/20/2011  4:40 AM 125* 135 - 145 mEq/L Final  11/19/2011  2:50 PM 124* 135 - 145 mEq/L Final  11/19/2011  5:07 AM 124* 135 - 145 mEq/L Final  11/18/2011  4:44 AM 128* 135 - 145 mEq/L Final  11/17/2011  3:50 AM 133* 135 - 145 mEq/L Final  11/16/2011 10:59 AM 134* 135 - 145 mEq/L Final   PMH:   Past Medical History  Diagnosis Date  . Osteoporosis   . Compression fracture   . Hypertension   . Pulmonary hypertension   . Mitral valve regurgitation     PSH:   Past Surgical History  Procedure Date  . Femur fracture surgery   . Spinal fusion     Allergies:  Allergies  Allergen Reactions  . Codeine Nausea Only    Medications:   Prior to Admission medications   Medication Sig Start Date End Date Taking? Authorizing Provider  aspirin EC 81 MG tablet Take 81 mg by mouth daily.   Yes Historical Provider, MD  calcium carbonate (TUMS - DOSED IN MG ELEMENTAL CALCIUM) 500 MG chewable tablet Chew 1 tablet by mouth daily.   Yes Historical Provider, MD  cholecalciferol (VITAMIN D) 1000 UNITS tablet Take 1,000 Units by mouth daily.   Yes Historical Provider, MD  lisinopril (PRINIVIL,ZESTRIL) 10 MG tablet Take 10 mg by mouth daily.   Yes Historical Provider, MD  Multiple Vitamins-Minerals (PRESERVISION/LUTEIN PO) Take 1 capsule by mouth daily.   Yes Historical Provider,  MD  raloxifene (EVISTA) 60 MG tablet Take 60 mg by mouth daily.   Yes Historical Provider, MD    Discontinued Meds:   Medications Discontinued During This Encounter  Medication Reason  . sodium chloride 0.9 % injection 3 mL     Social History:  reports that she has never smoked. She does not have any smokeless tobacco history on file. She reports that she does not drink alcohol or use illicit drugs.  Family History:  History reviewed. No pertinent family history.  A comprehensive review of systems was negative except for: Musculoskeletal: positive for arthralgias, back pain and pelvic pain  Blood pressure 159/76, pulse 77, temperature 98.5 F (36.9 C), temperature source Oral, resp. rate 18, height 5\' 4"  (1.626 m), weight 52.2 kg (115 lb 1.3 oz), SpO2 97.00%. General appearance: alert, cooperative, appears stated age, no distress and frail and cachectic Eyes: negative findings: lids and lashes normal, conjunctivae and sclerae normal, corneas clear and pupils equal, round, reactive to light and accomodation Neck: no adenopathy, no carotid bruit, no JVD, supple, symmetrical, trachea midline and thyroid not enlarged, symmetric, no tenderness/mass/nodules Resp: rales bibasilar Cardio: regular rate and rhythm, S1, S2 normal, no murmur, click, rub or gallop GI: soft, non-tender; bowel sounds normal; no masses,  no organomegaly Extremities:  edema 1+pedal L>R and abrasions on knees bilaterally Kyphotic spine  Labs: Basic Metabolic Panel:  Lab 11/20/11 2956 11/20/11 0440 11/19/11 1450 11/19/11 0507 11/18/11 0444 11/17/11 0350 11/16/11 1059  NA 121* 125* 124* 124* 128* 133* 134*  K 4.4 3.7 3.5 2.5* 3.3* 4.5 3.8  CL 93* 94* 90* 90* 97 101 99  CO2 22 23 24 26 25 23 20   GLUCOSE 124* 105* 155* 112* 111* 145* 151*  BUN 10 11 12 11 21  34* 32*  CREATININE 0.39* 0.40* 0.44* 0.44* 0.59 0.79 1.02  ALBUMIN -- 2.4* -- -- -- 2.6* --  CALCIUM 8.1* 8.3* 8.4 8.2* 7.9* 8.7 9.7  PHOS -- 1.3* -- -- -- --  --   Liver Function Tests:  Lab 11/20/11 0440 11/17/11 0350  AST -- 52*  ALT -- 24  ALKPHOS -- 40  BILITOT -- 0.4  PROT -- 4.9*  ALBUMIN 2.4* 2.6*   No results found for this basename: LIPASE:3,AMYLASE:3 in the last 168 hours No results found for this basename: AMMONIA:3 in the last 168 hours CBC:  Lab 11/19/11 0507 11/19/11 0005 11/18/11 0444 11/17/11 1349 11/16/11 1059  WBC 9.6 11.0* 11.4* 16.6* --  NEUTROABS -- -- -- -- 18.0*  HGB 10.9* 10.8* 7.9* 10.0* --  HCT 30.7* 29.9* 21.5* 27.9* --  MCV 85.0 84.5 84.0 84.3 --  PLT 115* 111* 109* 140* --   PT/INR: @labrcntip (inr:5) Cardiac Enzymes:  Lab 11/16/11 1059  CKTOTAL --  CKMB --  CKMBINDEX --  TROPONINI <0.30   CBG: No results found for this basename: GLUCAP:5 in the last 168 hours  Iron Studies:  Lab 11/16/11 2155  IRON 22*  TIBC 281  TRANSFERRIN --  FERRITIN 219    Xrays/Other Studies: No results found.   Assessment/Plan: 1.  hyponatremia- pt most likely was volume depleted upon admission, however she now has evidence of volume overload with pedal edema.  Will discontinue IVF's and start lasix.  Her Uosm was inappropriately high so SIADH is still a possibility.  Will check TSH, cortisol, CXR to r/o malignancy and cont to follow serum sodium closely.  No indication for tolvaptan at this time.  Pt also appears malnourished and poor solute intake may also account for hyponatremia.  Will follow 2. Bilateral pubic rami fx- per Ortho and primary svc 3. Protein and caloric malnutrition- encourage increased protein intake 4. ABLA- improved after blood transfusion.  Cont to follow 5. Iron deficiency- repleted after blood transfusion 6. Dementia/memory impairment- unclear if this is baseline or worse due to hyponatremia/acute illness.  Cont to follow 7. Hypokalemia- likely due to poor nutrition.  Will also check Mg level.  Cont to replete. 8. Dispo- was at [facility] but will likely need SNF  placement.   Sharon Harrison A 11/21/2011, 11:19 AM

## 2011-11-21 NOTE — Progress Notes (Signed)
Physical Therapy Treatment Patient Details Name: Sharon Harrison MRN: [id number] DOB: Nov 19, 1914 Today's Date: 11/21/2011 Time: 4098-1191 PT Time Calculation (min): 43 min  PT Assessment / Plan / Recommendation Comments on Treatment Session  +2 total assist for bed mobility and to pivot to recliner.     Follow Up Recommendations  Supervision/Assistance - 24 hour;Post acute inpatient     Does the patient have the potential to tolerate intense rehabilitation  No, Recommend SNF  Barriers to Discharge        Equipment Recommendations       Recommendations for Other Services    Frequency Min 3X/week   Plan Discharge plan remains appropriate    Precautions / Restrictions Precautions Precautions: Fall Precaution Comments: pelvic Fx Restrictions Weight Bearing Restrictions: No RLE Weight Bearing: Weight bearing as tolerated LLE Weight Bearing: Weight bearing as tolerated   Pertinent Vitals/Pain **Pt reported she hurt "all over". Unable to localize pain, RN aware. Pt moaned with movement. *    Mobility  Bed Mobility Bed Mobility: Supine to Sit;Sitting - Scoot to [place] of Bed;Rolling Right;Rolling Left Rolling Right: 1: +2 Total assist Rolling Right: Patient Percentage: 10% Rolling Left: 1: +2 Total assist Rolling Left: Patient Percentage: 10% Supine to Sit: 1: +2 Total assist;HOB elevated Supine to Sit: Patient Percentage: 10% Sitting - Scoot to Edge of Bed: 1: +2 Total assist Sitting - Scoot to Edge of Bed: Patient Percentage: 0% Sit to Supine: 1: +2 Total assist Sit to Supine: Patient Percentage: 0% Details for Bed Mobility Assistance: use of bed pad to slide pt around in bed; pt was soiled, performed rolling in bed for pericare Transfers Transfers: Sit to Stand;Stand to Sit;Stand Pivot Transfers Sit to Stand: 1: +2 Total assist;From bed Sit to Stand: Patient Percentage: 10% Stand to Sit: 1: +2 Total assist;To chair/3-in-1 Stand to Sit: Patient Percentage: 10% Details  for Transfer Assistance: Assist to clear bed, achieve upright position, release RUE grip from bedrail to pivot to chair, minimal WB thru BLEs Ambulation/Gait Ambulation/Gait Assistance: Not tested (comment)    Exercises     PT Diagnosis:    PT Problem List:   PT Treatment Interventions:     PT Goals Acute Rehab PT Goals Pt will go Supine/Side to Sit: with min assist PT Goal: Supine/Side to Sit - Progress: Not progressing Pt will Sit at [facility] of Bed: with min assist;6-10 min;with bilateral upper extremity support PT Goal: Sit at Edge Of Bed - Progress: Progressing toward goal Pt will go Sit to Supine/Side: with min assist PT Goal: Sit to Supine/Side - Progress: Not progressing Pt will go Sit to Stand: with min assist PT Goal: Sit to Stand - Progress: Not progressing Pt will go Stand to Sit: with min assist PT Goal: Stand to Sit - Progress: Not progressing  Visit Information  Last PT Received On: 11/21/11 Assistance Needed: +2    Subjective Data  Subjective: It would be good to sit up.  Patient Stated Goal: none stated   Cognition  Overall Cognitive Status: Appears within functional limits for tasks assessed/performed Arousal/Alertness: Awake/alert Orientation Level: Appears intact for tasks assessed Behavior During Session: [facility] for tasks performed    Balance  Balance Balance Assessed: Yes Static Sitting Balance Static Sitting - Balance Support: Bilateral upper extremity supported Static Sitting - Level of Assistance: 4: Min assist Static Sitting - Comment/# of Minutes: 4 minutes; pt with profound kyphosis  End of Session PT - End of Session Activity Tolerance: Patient limited by pain;Patient limited  by fatigue Patient left: with call bell/phone within reach;in chair;with family/visitor present Nurse Communication: Mobility status   GP     Sharon Harrison 11/21/2011, 10:32 AM [phone]

## 2011-11-22 ENCOUNTER — Inpatient Hospital Stay (HOSPITAL_COMMUNITY): Payer: Medicare Other

## 2011-11-22 LAB — RENAL FUNCTION PANEL
BUN: 12 mg/dL (ref 6–23)
CO2: 24 mEq/L (ref 19–32)
Calcium: 8.4 mg/dL (ref 8.4–10.5)
GFR calc Af Amer: >90 mL/min (ref >90)
Glucose, Bld: 100 mg/dL — ABNORMAL HIGH (ref 70–99)
Phosphorus: 2.3 mg/dL (ref 2.3–4.6)
Sodium: 122 mEq/L — ABNORMAL LOW (ref 135–145)

## 2011-11-22 LAB — CBC
HCT: 29.7 % — ABNORMAL LOW (ref 36.0–46.0)
Hemoglobin: 10.4 g/dL — ABNORMAL LOW (ref 12.0–15.0)
MCHC: 35 g/dL (ref 30.0–36.0)
MCV: 86.6 fL (ref 78.0–100.0)
RDW: 15.4 % (ref 11.5–15.5)

## 2011-11-22 LAB — OSMOLALITY: Osmolality: 257 mOsm/kg — ABNORMAL LOW (ref 275–300)

## 2011-11-22 MED ORDER — CALCIUM CARBONATE ANTACID 500 MG PO CHEW
1.0000 | CHEWABLE_TABLET | Freq: Every day | ORAL | Status: DC
  Administered 2011-11-22 – 2011-11-27 (×6): 200 mg via ORAL
  Filled 2011-11-22 (×6): qty 1

## 2011-11-22 MED ORDER — BENEPROTEIN PO POWD
1.0000 | Freq: Three times a day (TID) | ORAL | Status: DC
  Filled 2011-11-22: qty 227

## 2011-11-22 MED ORDER — RESOURCE INSTANT PROTEIN PO PWD PACKET
6.0000 g | Freq: Three times a day (TID) | ORAL | Status: DC
  Administered 2011-11-22 – 2011-11-27 (×16): 6 g via ORAL
  Filled 2011-11-22 (×19): qty 6

## 2011-11-22 MED ORDER — VITAMIN D3 25 MCG (1000 UNIT) PO TABS
1000.0000 [IU] | ORAL_TABLET | Freq: Every day | ORAL | Status: DC
  Administered 2011-11-22 – 2011-11-27 (×6): 1000 [IU] via ORAL
  Filled 2011-11-22 (×6): qty 1

## 2011-11-22 NOTE — Progress Notes (Signed)
MD on call was notified of patient's very brief episode of SVT.   No new orders at this time. Will continue to monitor the patient.

## 2011-11-22 NOTE — Progress Notes (Signed)
The monitor tech notified the RN that the patient's HR >150. Patient had a very brief episode of SVT <3 seconds.  Vitals were:- 97.7 F  HR 88 RR 18 B/P 149/91.Patient was asymptomatic.

## 2011-11-22 NOTE — Progress Notes (Signed)
Subjective: PT states she feels okay today.  Interval history: Pt continues to be in sinus rhythm. She's had some stabilization of her sodium and appears to have improved volume status. Objective: Filed Vitals:   11/21/11 2052 11/21/11 2150 11/22/11 0535 11/22/11 1425  BP: 152/85 149/91 150/85 112/74  Pulse: 105 88 102 106  Temp: 98.7 F (37.1 C) 97.7 F (36.5 C) 98.2 F (36.8 C) 98.5 F (36.9 C)  TempSrc: Oral Oral Oral Oral  Resp: 18 18 18 20   Height:      Weight:   52.9 kg (116 lb 10 oz)   SpO2: 97%  98% 98%   Weight change: 0.7 kg (1 lb 8.7 oz)  Intake/Output Summary (Last 24 hours) at 11/22/11 1812 Last data filed at 11/22/11 1400  Gross per 24 hour  Intake    670 ml  Output   1650 ml  Net   -980 ml    General: Alert, awake, oriented x3, in no acute distress. . Today the patient appears to be in less volume overloaded HEENT: [place]/AT [place], EOMI Neck: Trachea midline,  no masses, no thyromegal,y no JVD, no carotid bruit OROPHARYNX:  Moist, No exudate/ erythema/lesions.  Heart: Regular rate and rhythm, without murmurs, rubs, gallops, PMI non-displaced, no heaves or thrills on palpation.  Lungs: Clear to auscultation, no wheezing or rhonchi noted. No increased vocal fremitus resonant to percussion  Abdomen: Soft, nontender, nondistended, positive bowel sounds, no masses no hepatosplenomegaly noted..  Neuro: No focal neurological deficits noted.  Musculoskeletal: Decreased edema bilaterally in lower extremity   Lab Results:  Basename 11/22/11 0501 11/21/11 1046 11/20/11 1604 11/20/11 0440  NA 122* -- 121* --  K 3.4* -- 4.4 --  CL 92* -- 93* --  CO2 24 -- 22 --  GLUCOSE 100* -- 124* --  BUN 12 -- 10 --  CREATININE 0.42* -- 0.39* --  CALCIUM 8.4 -- 8.1* --  MG -- 1.8 -- --  PHOS 2.3 -- -- 1.3*    Basename 11/22/11 0501 11/20/11 0440  AST -- --  ALT -- --  ALKPHOS -- --  BILITOT -- --  PROT -- --  ALBUMIN 2.2* 2.4*   No results found for this basename:  LIPASE:2,AMYLASE:2 in the last 72 hours  Basename 11/22/11 0501  WBC 10.5  NEUTROABS --  HGB 10.4*  HCT 29.7*  MCV 86.6  PLT 226   No results found for this basename: CKTOTAL:3,CKMB:3,CKMBINDEX:3,TROPONINI:3 in the last 72 hours No components found with this basename: POCBNP:3 No results found for this basename: DDIMER:2 in the last 72 hours No results found for this basename: HGBA1C:2 in the last 72 hours No results found for this basename: CHOL:2,HDL:2,LDLCALC:2,TRIG:2,CHOLHDL:2,LDLDIRECT:2 in the last 72 hours  Basename 11/21/11 1046  TSH 3.826  T4TOTAL --  T3FREE --  THYROIDAB --   No results found for this basename: VITAMINB12:2,FOLATE:2,FERRITIN:2,TIBC:2,IRON:2,RETICCTPCT:2 in the last 72 hours  Micro Results: Recent Results (from the past 240 hour(s))  MRSA PCR SCREENING     Status: Normal   Collection Time   11/16/11  9:29 PM      Component Value Range Status Comment   MRSA by PCR NEGATIVE  NEGATIVE Final     Studies/Results: Ct Head Wo Contrast  11/16/2011  *RADIOLOGY REPORT*  Clinical Data:  78 year old female with headache and neck pain following fall.  CT HEAD WITHOUT CONTRAST CT CERVICAL SPINE WITHOUT CONTRAST  Technique:  Multidetector CT imaging of the head and cervical spine was performed following the standard  protocol without intravenous contrast.  Multiplanar CT image reconstructions of the cervical spine were also generated.  Comparison:  05/22/2005.  CT HEAD  Findings: Atrophy and chronic small vessel white matter ischemic changes are again identified.  No acute intracranial abnormalities are identified, including mass lesion or mass effect, hydrocephalus, extra-axial fluid collection, midline shift, hemorrhage, or acute infarction.  The visualized bony calvarium is unremarkable.  IMPRESSION: No evidence of acute intracranial abnormality.  Atrophy and chronic small vessel white matter ischemic changes.  CT CERVICAL SPINE  Findings: 1.5 mm retrolisthesis of C5  in relation to C4-C6 is identified, almost certainly degenerative. There is no evidence of acute fracture or prevertebral soft tissue swelling. Diffuse osteopenia and moderate facet arthropathy throughout the cervical spine noted. Severe degenerative disc disease at C4-C5 an C5-C6 noted. No focal bony lesions are present. No soft tissue abnormalities are noted.  IMPRESSION: No static evidence of acute injury to the cervical spine.  Mild retrolisthesis of C5 with severe degenerative changes at C4-C5 and C5-C6.  Diffuse osteopenia.   Original Report Authenticated By: Rosendo Gros, M.D.    Ct Cervical Spine Wo Contrast  11/16/2011  *RADIOLOGY REPORT*  Clinical Data:  78 year old female with headache and neck pain following fall.  CT HEAD WITHOUT CONTRAST CT CERVICAL SPINE WITHOUT CONTRAST  Technique:  Multidetector CT imaging of the head and cervical spine was performed following the standard protocol without intravenous contrast.  Multiplanar CT image reconstructions of the cervical spine were also generated.  Comparison:  05/22/2005.  CT HEAD  Findings: Atrophy and chronic small vessel white matter ischemic changes are again identified.  No acute intracranial abnormalities are identified, including mass lesion or mass effect, hydrocephalus, extra-axial fluid collection, midline shift, hemorrhage, or acute infarction.  The visualized bony calvarium is unremarkable.  IMPRESSION: No evidence of acute intracranial abnormality.  Atrophy and chronic small vessel white matter ischemic changes.  CT CERVICAL SPINE  Findings: 1.5 mm retrolisthesis of C5 in relation to C4-C6 is identified, almost certainly degenerative. There is no evidence of acute fracture or prevertebral soft tissue swelling. Diffuse osteopenia and moderate facet arthropathy throughout the cervical spine noted. Severe degenerative disc disease at C4-C5 an C5-C6 noted. No focal bony lesions are present. No soft tissue abnormalities are noted.   IMPRESSION: No static evidence of acute injury to the cervical spine.  Mild retrolisthesis of C5 with severe degenerative changes at C4-C5 and C5-C6.  Diffuse osteopenia.   Original Report Authenticated By: Rosendo Gros, M.D.    Ct Abdomen Pelvis W Contrast  11/16/2011  *RADIOLOGY REPORT*  Clinical Data: Pain post fall.  Possible pelvic fracture.  CT ABDOMEN AND PELVIS WITH CONTRAST  Technique:  Multidetector CT imaging of the abdomen and pelvis was performed following the standard protocol during bolus administration of intravenous contrast.  Contrast: OMNIPAQUE IOHEXOL 300 MG/ML  SOLN  Comparison: None.  Findings: Linear scarring or subsegmental atelectasis posteriorly in the visualized lung bases.  Calcified granuloma in the inferior lingula.  Sub centimeter subcapsular probable cyst in the medial left hepatic segment.  Otherwise unremarkable liver, gallbladder, spleen, adrenal glands, right kidney.  There is a small probable cyst in the upper pole left kidney.  No hydronephrosis.  Stomach and small bowel are nondistended.  Moderate fecal material in the colon. No definite ascites. No free air.  Minimally-displaced fractures of bilateral superior and inferior ischiopubic rami, comminuted and displaced on the left.  There is a moderately large prevesical, extraperitoneal, left retroperitoneal/presacral and anterior  body wall hematoma.  There are at least three focal areas of probable active extravasation noted, two just cephalad to the left superior ischiopubic ramus, the third  posterior to the right superior pubic ramus.  There is a minimally-displaced fracture through the left sacral ala without definite involvement of the sacral foramina.  Fixation hardware across the left femoral neck is partially seen. Progressive vertebral compression fracture deformities of T11, T12, L1, L2, and L4 since previous films of 03/11/2008.  There is mild retropulsion into the spinal canal at T11, T12, L1, and L2.  No  displaced posterior element fractures evident.  IMPRESSION:  1.  Acute fractures of superior and inferior ischiopubic rami, with a moderately large pelvic hematoma and evidence of bilateral active extravasation. I telephoned the critical test results to Dr. Fredderick Phenix at the time of interpretation. 2.  Minimally-displaced fracture of the left sacral ala. 3.  Multiple thoracolumbar compression fracture deformities, progressive since 03/11/2008.   Original Report Authenticated By: Osa Craver, M.D.    Ir Angiogram Pelvis Selective Or Supraselective  11/18/2011  *RADIOLOGY REPORT*  Clinical Data: 78 year old female with multiple pelvic fractures, pelvic hematoma, evidence of active bilateral extravasation in the anterior pelvis on CT.  She is currently hemodynamically stable.  PELVIC SELECTIVE ARTERIOGRAPHY,IR ULTRASOUND GUIDANCE VASC ACCESS RIGHT,TRANSCATHETER THERAPY EMBOLIZATION,ARTERIOGRAPHY,ADDITIONAL ARTERIOGRAPHY  Comparison: None.  Approach:  Right common femoral artery  Vessels catheterized:  Anterior branch left internal iliac artery (third order), anterior branch right internal iliac artery (second order)  Technique and findings: The procedure, risks (including but not limited to bleeding, infection, organ damage), benefits, and alternatives were explained to the patient and daughter.  Questions regarding the procedure were encouraged and answered.  The patient understands and consents to the procedure.The right femoral region was prepped and draped usual sterile fashion. Maximal barrier sterile technique was utilized including caps, mask, sterile gowns, sterile gloves, sterile drape, hand hygiene and skin antiseptic.  Intravenous Fentanyl and Versed were administered as conscious sedation during continuous cardiorespiratory monitoring by the radiology RN, with a total moderate sedation time of 120 minutes.  Under real time ultrasound guidance, the right common femoral artery was accessed with a  21-gauge micropuncture needle.  This was exchanged over a 018 guide wire for a transitional dilator, which allowed placement of a Benson wire into the abdominal aorta.  Over this, a 6-French vascular sheath was placed.  Through this, a 5- [place] C2 catheter was advanced in attempts to traverse the aortic bifurcation but because of the relatively acute angulation tortuosity, this was unsuccessful.  Aortography at the level of the bifurcation demonstrates mild tortuosity of the distal abdominal aorta   and common iliac arteries without significant atheromatous irregularity, dissection, or stenosis.  The C2 was exchanged for a Sos catheter, used to place a guide wire across the aortic bifurcation.  Over this, the C2 catheter was advanced.  After selective left internal iliac arteriography in multiple projections, an anterior branch of the internal iliac system supplying the region of active extravasation seen on CT was identified and selectively catheterized with the C2 catheter.  Gelfoam slurry embolization was performed.  Follow-up left internal iliac arteriography demonstrates  cessation of flow into the treated segment, no evident complication.  No active extravasation.  In similar fashion, the C2 catheter was directed into   the right internal iliac artery. Selective right pelvic arteriography in multiple projections was obtained.  An anterior branch supplying the region of active extravasation seen on CT was identified. The  branch was selectively catheterized and embolization  performed with a Gelfoam slurry.  Follow-up   right internal iliac arteriography demonstrates cessation of flow in the treated segment.  No evidence of active extravasation or other apparent complication.  The catheter was then removed.  After confirmatory right femoral arteriography, the sheath was removed and hemostasis achieved with the Exoseal device. The patient tolerated the procedure well and remained hemodynamically stable.  No  immediate complication.  IMPRESSION: 1.  Technically successful Gelfoam embolization of the anterior division left internal iliac artery. 2.  Technically successful Gelfoam embolization of the anterior vision right internal iliac artery.   Original Report Authenticated By: Osa Craver, M.D.    Ir Angiogram Pelvis Selective Or Supraselective  11/18/2011  *RADIOLOGY REPORT*  Clinical Data: 78 year old female with multiple pelvic fractures, pelvic hematoma, evidence of active bilateral extravasation in the anterior pelvis on CT.  She is currently hemodynamically stable.  PELVIC SELECTIVE ARTERIOGRAPHY,IR ULTRASOUND GUIDANCE VASC ACCESS RIGHT,TRANSCATHETER THERAPY EMBOLIZATION,ARTERIOGRAPHY,ADDITIONAL ARTERIOGRAPHY  Comparison: None.  Approach:  Right common femoral artery  Vessels catheterized:  Anterior branch left internal iliac artery (third order), anterior branch right internal iliac artery (second order)  Technique and findings: The procedure, risks (including but not limited to bleeding, infection, organ damage), benefits, and alternatives were explained to the patient and daughter.  Questions regarding the procedure were encouraged and answered.  The patient understands and consents to the procedure.The right femoral region was prepped and draped usual sterile fashion. Maximal barrier sterile technique was utilized including caps, mask, sterile gowns, sterile gloves, sterile drape, hand hygiene and skin antiseptic.  Intravenous Fentanyl and Versed were administered as conscious sedation during continuous cardiorespiratory monitoring by the radiology RN, with a total moderate sedation time of 120 minutes.  Under real time ultrasound guidance, the right common femoral artery was accessed with a 21-gauge micropuncture needle.  This was exchanged over a 018 guide wire for a transitional dilator, which allowed placement of a Benson wire into the abdominal aorta.  Over this, a 6-French vascular sheath  was placed.  Through this, a 5- [place] C2 catheter was advanced in attempts to traverse the aortic bifurcation but because of the relatively acute angulation tortuosity, this was unsuccessful.  Aortography at the level of the bifurcation demonstrates mild tortuosity of the distal abdominal aorta   and common iliac arteries without significant atheromatous irregularity, dissection, or stenosis.  The C2 was exchanged for a Sos catheter, used to place a guide wire across the aortic bifurcation.  Over this, the C2 catheter was advanced.  After selective left internal iliac arteriography in multiple projections, an anterior branch of the internal iliac system supplying the region of active extravasation seen on CT was identified and selectively catheterized with the C2 catheter.  Gelfoam slurry embolization was performed.  Follow-up left internal iliac arteriography demonstrates  cessation of flow into the treated segment, no evident complication.  No active extravasation.  In similar fashion, the C2 catheter was directed into   the right internal iliac artery. Selective right pelvic arteriography in multiple projections was obtained.  An anterior branch supplying the region of active extravasation seen on CT was identified. The branch was selectively catheterized and embolization  performed with a Gelfoam slurry.  Follow-up   right internal iliac arteriography demonstrates cessation of flow in the treated segment.  No evidence of active extravasation or other apparent complication.  The catheter was then removed.  After confirmatory right femoral arteriography, the sheath was removed and hemostasis  achieved with the Exoseal device. The patient tolerated the procedure well and remained hemodynamically stable.  No immediate complication.  IMPRESSION: 1.  Technically successful Gelfoam embolization of the anterior division left internal iliac artery. 2.  Technically successful Gelfoam embolization of the anterior vision  right internal iliac artery.   Original Report Authenticated By: Osa Craver, M.D.    Ir Angiogram Selective Each Additional Vessel  11/18/2011  *RADIOLOGY REPORT*  Clinical Data: 78 year old female with multiple pelvic fractures, pelvic hematoma, evidence of active bilateral extravasation in the anterior pelvis on CT.  She is currently hemodynamically stable.  PELVIC SELECTIVE ARTERIOGRAPHY,IR ULTRASOUND GUIDANCE VASC ACCESS RIGHT,TRANSCATHETER THERAPY EMBOLIZATION,ARTERIOGRAPHY,ADDITIONAL ARTERIOGRAPHY  Comparison: None.  Approach:  Right common femoral artery  Vessels catheterized:  Anterior branch left internal iliac artery (third order), anterior branch right internal iliac artery (second order)  Technique and findings: The procedure, risks (including but not limited to bleeding, infection, organ damage), benefits, and alternatives were explained to the patient and daughter.  Questions regarding the procedure were encouraged and answered.  The patient understands and consents to the procedure.The right femoral region was prepped and draped usual sterile fashion. Maximal barrier sterile technique was utilized including caps, mask, sterile gowns, sterile gloves, sterile drape, hand hygiene and skin antiseptic.  Intravenous Fentanyl and Versed were administered as conscious sedation during continuous cardiorespiratory monitoring by the radiology RN, with a total moderate sedation time of 120 minutes.  Under real time ultrasound guidance, the right common femoral artery was accessed with a 21-gauge micropuncture needle.  This was exchanged over a 018 guide wire for a transitional dilator, which allowed placement of a Benson wire into the abdominal aorta.  Over this, a 6-French vascular sheath was placed.  Through this, a 5- [place] C2 catheter was advanced in attempts to traverse the aortic bifurcation but because of the relatively acute angulation tortuosity, this was unsuccessful.  Aortography at the  level of the bifurcation demonstrates mild tortuosity of the distal abdominal aorta   and common iliac arteries without significant atheromatous irregularity, dissection, or stenosis.  The C2 was exchanged for a Sos catheter, used to place a guide wire across the aortic bifurcation.  Over this, the C2 catheter was advanced.  After selective left internal iliac arteriography in multiple projections, an anterior branch of the internal iliac system supplying the region of active extravasation seen on CT was identified and selectively catheterized with the C2 catheter.  Gelfoam slurry embolization was performed.  Follow-up left internal iliac arteriography demonstrates  cessation of flow into the treated segment, no evident complication.  No active extravasation.  In similar fashion, the C2 catheter was directed into   the right internal iliac artery. Selective right pelvic arteriography in multiple projections was obtained.  An anterior branch supplying the region of active extravasation seen on CT was identified. The branch was selectively catheterized and embolization  performed with a Gelfoam slurry.  Follow-up   right internal iliac arteriography demonstrates cessation of flow in the treated segment.  No evidence of active extravasation or other apparent complication.  The catheter was then removed.  After confirmatory right femoral arteriography, the sheath was removed and hemostasis achieved with the Exoseal device. The patient tolerated the procedure well and remained hemodynamically stable.  No immediate complication.  IMPRESSION: 1.  Technically successful Gelfoam embolization of the anterior division left internal iliac artery. 2.  Technically successful Gelfoam embolization of the anterior vision right internal iliac artery.   Original Report Authenticated By: D.  DANIEL HASSELL III, M.D.    Ir Angiogram Selective Each Additional Vessel  11/18/2011  *RADIOLOGY REPORT*  Clinical Data: 78 year old female  with multiple pelvic fractures, pelvic hematoma, evidence of active bilateral extravasation in the anterior pelvis on CT.  She is currently hemodynamically stable.  PELVIC SELECTIVE ARTERIOGRAPHY,IR ULTRASOUND GUIDANCE VASC ACCESS RIGHT,TRANSCATHETER THERAPY EMBOLIZATION,ARTERIOGRAPHY,ADDITIONAL ARTERIOGRAPHY  Comparison: None.  Approach:  Right common femoral artery  Vessels catheterized:  Anterior branch left internal iliac artery (third order), anterior branch right internal iliac artery (second order)  Technique and findings: The procedure, risks (including but not limited to bleeding, infection, organ damage), benefits, and alternatives were explained to the patient and daughter.  Questions regarding the procedure were encouraged and answered.  The patient understands and consents to the procedure.The right femoral region was prepped and draped usual sterile fashion. Maximal barrier sterile technique was utilized including caps, mask, sterile gowns, sterile gloves, sterile drape, hand hygiene and skin antiseptic.  Intravenous Fentanyl and Versed were administered as conscious sedation during continuous cardiorespiratory monitoring by the radiology RN, with a total moderate sedation time of 120 minutes.  Under real time ultrasound guidance, the right common femoral artery was accessed with a 21-gauge micropuncture needle.  This was exchanged over a 018 guide wire for a transitional dilator, which allowed placement of a Benson wire into the abdominal aorta.  Over this, a 6-French vascular sheath was placed.  Through this, a 5- [place] C2 catheter was advanced in attempts to traverse the aortic bifurcation but because of the relatively acute angulation tortuosity, this was unsuccessful.  Aortography at the level of the bifurcation demonstrates mild tortuosity of the distal abdominal aorta   and common iliac arteries without significant atheromatous irregularity, dissection, or stenosis.  The C2 was exchanged for a  Sos catheter, used to place a guide wire across the aortic bifurcation.  Over this, the C2 catheter was advanced.  After selective left internal iliac arteriography in multiple projections, an anterior branch of the internal iliac system supplying the region of active extravasation seen on CT was identified and selectively catheterized with the C2 catheter.  Gelfoam slurry embolization was performed.  Follow-up left internal iliac arteriography demonstrates  cessation of flow into the treated segment, no evident complication.  No active extravasation.  In similar fashion, the C2 catheter was directed into   the right internal iliac artery. Selective right pelvic arteriography in multiple projections was obtained.  An anterior branch supplying the region of active extravasation seen on CT was identified. The branch was selectively catheterized and embolization  performed with a Gelfoam slurry.  Follow-up   right internal iliac arteriography demonstrates cessation of flow in the treated segment.  No evidence of active extravasation or other apparent complication.  The catheter was then removed.  After confirmatory right femoral arteriography, the sheath was removed and hemostasis achieved with the Exoseal device. The patient tolerated the procedure well and remained hemodynamically stable.  No immediate complication.  IMPRESSION: 1.  Technically successful Gelfoam embolization of the anterior division left internal iliac artery. 2.  Technically successful Gelfoam embolization of the anterior vision right internal iliac artery.   Original Report Authenticated By: Osa Craver, M.D.    Ir Transcath/emboliz  11/18/2011  *RADIOLOGY REPORT*  Clinical Data: 78 year old female with multiple pelvic fractures, pelvic hematoma, evidence of active bilateral extravasation in the anterior pelvis on CT.  She is currently hemodynamically stable.  PELVIC SELECTIVE ARTERIOGRAPHY,IR ULTRASOUND GUIDANCE VASC ACCESS  RIGHT,TRANSCATHETER THERAPY EMBOLIZATION,ARTERIOGRAPHY,ADDITIONAL ARTERIOGRAPHY  Comparison: None.  Approach:  Right common femoral artery  Vessels catheterized:  Anterior branch left internal iliac artery (third order), anterior branch right internal iliac artery (second order)  Technique and findings: The procedure, risks (including but not limited to bleeding, infection, organ damage), benefits, and alternatives were explained to the patient and daughter.  Questions regarding the procedure were encouraged and answered.  The patient understands and consents to the procedure.The right femoral region was prepped and draped usual sterile fashion. Maximal barrier sterile technique was utilized including caps, mask, sterile gowns, sterile gloves, sterile drape, hand hygiene and skin antiseptic.  Intravenous Fentanyl and Versed were administered as conscious sedation during continuous cardiorespiratory monitoring by the radiology RN, with a total moderate sedation time of 120 minutes.  Under real time ultrasound guidance, the right common femoral artery was accessed with a 21-gauge micropuncture needle.  This was exchanged over a 018 guide wire for a transitional dilator, which allowed placement of a Benson wire into the abdominal aorta.  Over this, a 6-French vascular sheath was placed.  Through this, a 5- [place] C2 catheter was advanced in attempts to traverse the aortic bifurcation but because of the relatively acute angulation tortuosity, this was unsuccessful.  Aortography at the level of the bifurcation demonstrates mild tortuosity of the distal abdominal aorta   and common iliac arteries without significant atheromatous irregularity, dissection, or stenosis.  The C2 was exchanged for a Sos catheter, used to place a guide wire across the aortic bifurcation.  Over this, the C2 catheter was advanced.  After selective left internal iliac arteriography in multiple projections, an anterior branch of the internal iliac  system supplying the region of active extravasation seen on CT was identified and selectively catheterized with the C2 catheter.  Gelfoam slurry embolization was performed.  Follow-up left internal iliac arteriography demonstrates  cessation of flow into the treated segment, no evident complication.  No active extravasation.  In similar fashion, the C2 catheter was directed into   the right internal iliac artery. Selective right pelvic arteriography in multiple projections was obtained.  An anterior branch supplying the region of active extravasation seen on CT was identified. The branch was selectively catheterized and embolization  performed with a Gelfoam slurry.  Follow-up   right internal iliac arteriography demonstrates cessation of flow in the treated segment.  No evidence of active extravasation or other apparent complication.  The catheter was then removed.  After confirmatory right femoral arteriography, the sheath was removed and hemostasis achieved with the Exoseal device. The patient tolerated the procedure well and remained hemodynamically stable.  No immediate complication.  IMPRESSION: 1.  Technically successful Gelfoam embolization of the anterior division left internal iliac artery. 2.  Technically successful Gelfoam embolization of the anterior vision right internal iliac artery.   Original Report Authenticated By: Osa Craver, M.D.    Ir Transcath/emboliz  11/18/2011  *RADIOLOGY REPORT*  Clinical Data: 78 year old female with multiple pelvic fractures, pelvic hematoma, evidence of active bilateral extravasation in the anterior pelvis on CT.  She is currently hemodynamically stable.  PELVIC SELECTIVE ARTERIOGRAPHY,IR ULTRASOUND GUIDANCE VASC ACCESS RIGHT,TRANSCATHETER THERAPY EMBOLIZATION,ARTERIOGRAPHY,ADDITIONAL ARTERIOGRAPHY  Comparison: None.  Approach:  Right common femoral artery  Vessels catheterized:  Anterior branch left internal iliac artery (third order), anterior branch  right internal iliac artery (second order)  Technique and findings: The procedure, risks (including but not limited to bleeding, infection, organ damage), benefits, and alternatives were explained to the patient and daughter.  Questions regarding the procedure  were encouraged and answered.  The patient understands and consents to the procedure.The right femoral region was prepped and draped usual sterile fashion. Maximal barrier sterile technique was utilized including caps, mask, sterile gowns, sterile gloves, sterile drape, hand hygiene and skin antiseptic.  Intravenous Fentanyl and Versed were administered as conscious sedation during continuous cardiorespiratory monitoring by the radiology RN, with a total moderate sedation time of 120 minutes.  Under real time ultrasound guidance, the right common femoral artery was accessed with a 21-gauge micropuncture needle.  This was exchanged over a 018 guide wire for a transitional dilator, which allowed placement of a Benson wire into the abdominal aorta.  Over this, a 6-French vascular sheath was placed.  Through this, a 5- [place] C2 catheter was advanced in attempts to traverse the aortic bifurcation but because of the relatively acute angulation tortuosity, this was unsuccessful.  Aortography at the level of the bifurcation demonstrates mild tortuosity of the distal abdominal aorta   and common iliac arteries without significant atheromatous irregularity, dissection, or stenosis.  The C2 was exchanged for a Sos catheter, used to place a guide wire across the aortic bifurcation.  Over this, the C2 catheter was advanced.  After selective left internal iliac arteriography in multiple projections, an anterior branch of the internal iliac system supplying the region of active extravasation seen on CT was identified and selectively catheterized with the C2 catheter.  Gelfoam slurry embolization was performed.  Follow-up left internal iliac arteriography demonstrates   cessation of flow into the treated segment, no evident complication.  No active extravasation.  In similar fashion, the C2 catheter was directed into   the right internal iliac artery. Selective right pelvic arteriography in multiple projections was obtained.  An anterior branch supplying the region of active extravasation seen on CT was identified. The branch was selectively catheterized and embolization  performed with a Gelfoam slurry.  Follow-up   right internal iliac arteriography demonstrates cessation of flow in the treated segment.  No evidence of active extravasation or other apparent complication.  The catheter was then removed.  After confirmatory right femoral arteriography, the sheath was removed and hemostasis achieved with the Exoseal device. The patient tolerated the procedure well and remained hemodynamically stable.  No immediate complication.  IMPRESSION: 1.  Technically successful Gelfoam embolization of the anterior division left internal iliac artery. 2.  Technically successful Gelfoam embolization of the anterior vision right internal iliac artery.   Original Report Authenticated By: Osa Craver, M.D.    Ir Angiogram Follow Up Study  11/18/2011  *RADIOLOGY REPORT*  Clinical Data: 78 year old female with multiple pelvic fractures, pelvic hematoma, evidence of active bilateral extravasation in the anterior pelvis on CT.  She is currently hemodynamically stable.  PELVIC SELECTIVE ARTERIOGRAPHY,IR ULTRASOUND GUIDANCE VASC ACCESS RIGHT,TRANSCATHETER THERAPY EMBOLIZATION,ARTERIOGRAPHY,ADDITIONAL ARTERIOGRAPHY  Comparison: None.  Approach:  Right common femoral artery  Vessels catheterized:  Anterior branch left internal iliac artery (third order), anterior branch right internal iliac artery (second order)  Technique and findings: The procedure, risks (including but not limited to bleeding, infection, organ damage), benefits, and alternatives were explained to the patient and daughter.   Questions regarding the procedure were encouraged and answered.  The patient understands and consents to the procedure.The right femoral region was prepped and draped usual sterile fashion. Maximal barrier sterile technique was utilized including caps, mask, sterile gowns, sterile gloves, sterile drape, hand hygiene and skin antiseptic.  Intravenous Fentanyl and Versed were administered as conscious sedation during continuous cardiorespiratory monitoring  by the radiology RN, with a total moderate sedation time of 120 minutes.  Under real time ultrasound guidance, the right common femoral artery was accessed with a 21-gauge micropuncture needle.  This was exchanged over a 018 guide wire for a transitional dilator, which allowed placement of a Benson wire into the abdominal aorta.  Over this, a 6-French vascular sheath was placed.  Through this, a 5- [place] C2 catheter was advanced in attempts to traverse the aortic bifurcation but because of the relatively acute angulation tortuosity, this was unsuccessful.  Aortography at the level of the bifurcation demonstrates mild tortuosity of the distal abdominal aorta   and common iliac arteries without significant atheromatous irregularity, dissection, or stenosis.  The C2 was exchanged for a Sos catheter, used to place a guide wire across the aortic bifurcation.  Over this, the C2 catheter was advanced.  After selective left internal iliac arteriography in multiple projections, an anterior branch of the internal iliac system supplying the region of active extravasation seen on CT was identified and selectively catheterized with the C2 catheter.  Gelfoam slurry embolization was performed.  Follow-up left internal iliac arteriography demonstrates  cessation of flow into the treated segment, no evident complication.  No active extravasation.  In similar fashion, the C2 catheter was directed into   the right internal iliac artery. Selective right pelvic arteriography in  multiple projections was obtained.  An anterior branch supplying the region of active extravasation seen on CT was identified. The branch was selectively catheterized and embolization  performed with a Gelfoam slurry.  Follow-up   right internal iliac arteriography demonstrates cessation of flow in the treated segment.  No evidence of active extravasation or other apparent complication.  The catheter was then removed.  After confirmatory right femoral arteriography, the sheath was removed and hemostasis achieved with the Exoseal device. The patient tolerated the procedure well and remained hemodynamically stable.  No immediate complication.  IMPRESSION: 1.  Technically successful Gelfoam embolization of the anterior division left internal iliac artery. 2.  Technically successful Gelfoam embolization of the anterior vision right internal iliac artery.   Original Report Authenticated By: Osa Craver, M.D.    Ir Angiogram Follow Up Study  11/16/2011  Dayne Oley Balm III, MD     11/16/2011  6:12 PM Bilateral internal iliac artery anterior branch  gelfoam  embolization No complication No blood loss. See complete dictation in [facility].    Ir US Guide Vasc Access Right  11/18/2011  *RADIOLOGY REPORT*  Clinical Data: 78 year old female with multiple pelvic fractures, pelvic hematoma, evidence of active bilateral extravasation in the anterior pelvis on CT.  She is currently hemodynamically stable.  PELVIC SELECTIVE ARTERIOGRAPHY,IR ULTRASOUND GUIDANCE VASC ACCESS RIGHT,TRANSCATHETER THERAPY EMBOLIZATION,ARTERIOGRAPHY,ADDITIONAL ARTERIOGRAPHY  Comparison: None.  Approach:  Right common femoral artery  Vessels catheterized:  Anterior branch left internal iliac artery (third order), anterior branch right internal iliac artery (second order)  Technique and findings: The procedure, risks (including but not limited to bleeding, infection, organ damage), benefits, and alternatives were explained to the  patient and daughter.  Questions regarding the procedure were encouraged and answered.  The patient understands and consents to the procedure.The right femoral region was prepped and draped usual sterile fashion. Maximal barrier sterile technique was utilized including caps, mask, sterile gowns, sterile gloves, sterile drape, hand hygiene and skin antiseptic.  Intravenous Fentanyl and Versed were administered as conscious sedation during continuous cardiorespiratory monitoring by the radiology RN, with a total moderate sedation time of  120 minutes.  Under real time ultrasound guidance, the right common femoral artery was accessed with a 21-gauge micropuncture needle.  This was exchanged over a 018 guide wire for a transitional dilator, which allowed placement of a Benson wire into the abdominal aorta.  Over this, a 6-French vascular sheath was placed.  Through this, a 5- [place] C2 catheter was advanced in attempts to traverse the aortic bifurcation but because of the relatively acute angulation tortuosity, this was unsuccessful.  Aortography at the level of the bifurcation demonstrates mild tortuosity of the distal abdominal aorta   and common iliac arteries without significant atheromatous irregularity, dissection, or stenosis.  The C2 was exchanged for a Sos catheter, used to place a guide wire across the aortic bifurcation.  Over this, the C2 catheter was advanced.  After selective left internal iliac arteriography in multiple projections, an anterior branch of the internal iliac system supplying the region of active extravasation seen on CT was identified and selectively catheterized with the C2 catheter.  Gelfoam slurry embolization was performed.  Follow-up left internal iliac arteriography demonstrates  cessation of flow into the treated segment, no evident complication.  No active extravasation.  In similar fashion, the C2 catheter was directed into   the right internal iliac artery. Selective right pelvic  arteriography in multiple projections was obtained.  An anterior branch supplying the region of active extravasation seen on CT was identified. The branch was selectively catheterized and embolization  performed with a Gelfoam slurry.  Follow-up   right internal iliac arteriography demonstrates cessation of flow in the treated segment.  No evidence of active extravasation or other apparent complication.  The catheter was then removed.  After confirmatory right femoral arteriography, the sheath was removed and hemostasis achieved with the Exoseal device. The patient tolerated the procedure well and remained hemodynamically stable.  No immediate complication.  IMPRESSION: 1.  Technically successful Gelfoam embolization of the anterior division left internal iliac artery. 2.  Technically successful Gelfoam embolization of the anterior vision right internal iliac artery.   Original Report Authenticated By: Osa Craver, M.D.    Dg Abd Acute W/chest  11/16/2011  *RADIOLOGY REPORT*  Clinical Data: Abdominal distention, abdominal pain  ACUTE ABDOMEN SERIES (ABDOMEN 2 VIEW & CHEST 1 VIEW)  Comparison: Abdominal film 03/11/2008  Findings: Mild enlarged cardiac silhouette with ectatic aorta. There is no free air beneath hemidiaphragms.  Lungs are hyperinflated but clear.  The left lateral decubitus view demonstrates no intraperitoneal free air.  No dilated dilated loops of large or small bowel.  There is gas the rectum.  There is a left hip internal fixation.  There is irregularity along the superior pubic rami on the left.  IMPRESSION:  1.  No acute cardiopulmonary findings. 2.  No evidence of bowel obstruction or intraperitoneal free air. 3.  Concern for left pubic rami fracture.  Recommend dedicated views of the pelvis or CT for further evaluation.   Original Report Authenticated By: Genevive Bi, M.D.     Medications: I have reviewed the patient's current medications. Scheduled Meds:    . calcium  carbonate  1 tablet Oral Daily  . cholecalciferol  1,000 Units Oral Daily  . docusate sodium  100 mg Oral BID  . feeding supplement  1 Container Oral BID BM  . furosemide  20 mg Intravenous Daily  . protein supplement  6 g Oral TID WC  . DISCONTD: protein supplement  1 scoop Oral TID WC   Continuous Infusions:    .  sodium chloride 0.9 % 1,000 mL with potassium chloride 40 mEq infusion 10 mL/hr at 11/21/11 1131   PRN Meds:.acetaminophen, acetaminophen, albuterol, HYDROcodone-acetaminophen, morphine injection, ondansetron (ZOFRAN) IV, ondansetron, polyethylene glycol Assessment/Plan: Patient Active Hospital Problem List: Hyponatremia  Patient has very low protein stores. Her normal sodium of arrival to the hospital likely represents a hemoconcentrated state is. I suspect this patient has had some chronic hyponatremia. Her low protein stores at this point may make it very difficult for her to have effective diuresis for sodium concentration in the serum.  Pelvic hematoma, female (11/16/2011) PT is s/p gelfoam embolization  And s/p  Transfusion of 2 Units  PRBC's.  Hb is stable at present. Continue to monitor.  Bilateral pubic rami fractures (11/16/2011) Appreciate Ortho input. PT/OT. OOB as tolerated.   Anemia due to blood loss, acute (11/16/2011) S/PTransfusion of 2 Units  PRBC's.  Hb is stable at present. Continue to monitor.  Atrial Fibrillation Pt has converted back to sinus rhythm.  Hypokalemia:  Replace potassium as necessary.   Leukocytosis  Resolved  HTN (hypertension) BP adequately but not optimally controlled.  Protein Calorie Malnutrition Pt appears chronically malnourished and has had reported poor intake. Appreciate input from nutrition.      LOS: 6 days

## 2011-11-22 NOTE — Progress Notes (Signed)
Patient ID: Sharon Harrison, female   DOB: December 22, 1914, 78 y.o.   MRN: [id number] S:Pt without new complaints today O:BP 150/85  Pulse 102  Temp 98.2 F (36.8 C) (Oral)  Resp 18  Ht 5\' 4"  (1.626 m)  Wt 52.9 kg (116 lb 10 oz)  BMI 20.02 kg/m2  SpO2 98%  Intake/Output Summary (Last 24 hours) at 11/22/11 0857 Last data filed at 11/22/11 0700  Gross per 24 hour  Intake   1511 ml  Output   2750 ml  Net  -1239 ml   Intake/Output: I/O last 3 completed shifts: In: 3751.4 [P.O.:990; I.V.:2759.4; IV Piggyback:2] Out: 3675 [Urine:3675]  Intake/Output this shift:    Weight change: 0.7 kg (1 lb 8.7 oz) WUJ:WJXBJ, elderly kyphotic WF in NAD YNW:GNFAO Resp:decreased BS ZHY:QMVHQI Ext:tr edema   Lab 11/22/11 0501 11/20/11 1604 11/20/11 0440 11/19/11 1450 11/19/11 0507 11/18/11 0444 11/17/11 0350  NA 122* 121* 125* 124* 124* 128* 133*  K 3.4* 4.4 3.7 3.5 2.5* 3.3* 4.5  CL 92* 93* 94* 90* 90* 97 101  CO2 24 22 23 24 26 25 23   GLUCOSE 100* 124* 105* 155* 112* 111* 145*  BUN 12 10 11 12 11 21  34*  CREATININE 0.42* 0.39* 0.40* 0.44* 0.44* 0.59 0.79  ALBUMIN 2.2* -- 2.4* -- -- -- 2.6*  CALCIUM 8.4 8.1* 8.3* 8.4 8.2* 7.9* 8.7  PHOS 2.3 -- 1.3* -- -- -- --  AST -- -- -- -- -- -- 52*  ALT -- -- -- -- -- -- 24   Liver Function Tests:  Lab 11/22/11 0501 11/20/11 0440 11/17/11 0350  AST -- -- 52*  ALT -- -- 24  ALKPHOS -- -- 40  BILITOT -- -- 0.4  PROT -- -- 4.9*  ALBUMIN 2.2* 2.4* 2.6*   No results found for this basename: LIPASE:3,AMYLASE:3 in the last 168 hours No results found for this basename: AMMONIA:3 in the last 168 hours CBC:  Lab 11/22/11 0501 11/19/11 0507 11/19/11 0005 11/18/11 0444 11/17/11 1349 11/16/11 1059  WBC 10.5 9.6 11.0* -- -- --  NEUTROABS -- -- -- -- -- 18.0*  HGB 10.4* 10.9* 10.8* -- -- --  HCT 29.7* 30.7* 29.9* -- -- --  MCV 86.6 85.0 84.5 84.0 84.3 --  PLT 226 115* 111* -- -- --   Cardiac Enzymes:  Lab 11/16/11 1059  CKTOTAL --  CKMB --  CKMBINDEX  --  TROPONINI <0.30   CBG: No results found for this basename: GLUCAP:5 in the last 168 hours  Iron Studies: No results found for this basename: IRON,TIBC,TRANSFERRIN,FERRITIN in the last 72 hours Studies/Results: No results found.    . docusate sodium  100 mg Oral BID  . feeding supplement  1 Container Oral BID BM  . furosemide  20 mg Intravenous Daily    BMET    Component Value Date/Time   NA 122* 11/22/2011 0501   K 3.4* 11/22/2011 0501   CL 92* 11/22/2011 0501   CO2 24 11/22/2011 0501   GLUCOSE 100* 11/22/2011 0501   BUN 12 11/22/2011 0501   CREATININE 0.42* 11/22/2011 0501   CALCIUM 8.4 11/22/2011 0501   GFRNONAA 85* 11/22/2011 0501   GFRAA >90 11/22/2011 0501   CBC    Component Value Date/Time   WBC 10.5 11/22/2011 0501   RBC 3.43* 11/22/2011 0501   HGB 10.4* 11/22/2011 0501   HCT 29.7* 11/22/2011 0501   PLT 226 11/22/2011 0501   MCV 86.6 11/22/2011 0501   MCH 30.3 11/22/2011 0501  MCHC 35.0 11/22/2011 0501   RDW 15.4 11/22/2011 0501   LYMPHSABS 1.2 11/16/2011 1059   MONOABS 1.4* 11/16/2011 1059   EOSABS 0.0 11/16/2011 1059   BASOSABS 0.0 11/16/2011 1059    Assessment/Plan:  1. Hyponatremia- pt most likely was volume depleted upon admission, however she now has evidence of volume overload with pedal edema. Will discontinue IVF's and start lasix. Her Uosm was inappropriately high so SIADH is still a possibility.  TSH WNL, cortisol WNL, CXR to r/o malignancy is Pending.  Na slightly improved, will cont with lasix and fluid restriction and follow serum sodium closely. No indication for tolvaptan at this time. Pt also appears malnourished and poor solute intake may also account for hyponatremia. Will follow 2. Bilateral pubic rami fx- per Ortho and primary svc 3. Protein and caloric malnutrition- encourage increased protein intake and will start beneprotein. 4. ABLA- improved after blood transfusion. Cont to follow 5. Iron deficiency- repleted after blood  transfusion 6. Dementia/memory impairment- unclear if this is baseline or worse due to hyponatremia/acute illness. Cont to follow 7. Hypokalemia- likely due to poor nutrition. Will also check Mg level. Cont to replete. 8. Dispo- was at [facility] but will likely need SNF placement. 9.  Srah Ake A

## 2011-11-23 LAB — RENAL FUNCTION PANEL
BUN: 19 mg/dL (ref 6–23)
CO2: 25 mEq/L (ref 19–32)
Calcium: 8.7 mg/dL (ref 8.4–10.5)
GFR calc Af Amer: >90 mL/min (ref >90)
Glucose, Bld: 91 mg/dL (ref 70–99)
Potassium: 3.4 mEq/L — ABNORMAL LOW (ref 3.5–5.1)
Sodium: 124 mEq/L — ABNORMAL LOW (ref 135–145)

## 2011-11-23 LAB — CBC
HCT: 30.1 % — ABNORMAL LOW (ref 36.0–46.0)
Hemoglobin: 10.6 g/dL — ABNORMAL LOW (ref 12.0–15.0)
MCV: 86.5 fL (ref 78.0–100.0)
WBC: 8.6 10*3/uL (ref 4.0–10.5)

## 2011-11-23 MED ORDER — POTASSIUM CHLORIDE 20 MEQ/15ML (10%) PO LIQD
20.0000 meq | Freq: Two times a day (BID) | ORAL | Status: DC
  Administered 2011-11-23: 20 meq via ORAL
  Filled 2011-11-23 (×2): qty 15

## 2011-11-23 MED ORDER — FUROSEMIDE 20 MG PO TABS
20.0000 mg | ORAL_TABLET | Freq: Two times a day (BID) | ORAL | Status: DC
  Administered 2011-11-23 – 2011-11-24 (×3): 20 mg via ORAL
  Filled 2011-11-23 (×5): qty 1

## 2011-11-23 MED ORDER — POTASSIUM CHLORIDE 20 MEQ/15ML (10%) PO LIQD
40.0000 meq | Freq: Two times a day (BID) | ORAL | Status: DC
  Administered 2011-11-23 – 2011-11-27 (×8): 40 meq via ORAL
  Filled 2011-11-23 (×9): qty 30

## 2011-11-23 NOTE — Progress Notes (Signed)
Patient ID: Sharon Harrison, female   DOB: 01/05/1915, 78 y.o.   MRN: [id number] S:feels better O:BP 137/73  Pulse 78  Temp 97.7 F (36.5 C) (Oral)  Resp 20  Ht 5\' 4"  (1.626 m)  Wt 51.8 kg (114 lb 3.2 oz)  BMI 19.60 kg/m2  SpO2 96%  Intake/Output Summary (Last 24 hours) at 11/23/11 0829 Last data filed at 11/23/11 0600  Gross per 24 hour  Intake    460 ml  Output   1000 ml  Net   -540 ml   Intake/Output: I/O last 3 completed shifts: In: 820 [P.O.:700; I.V.:120] Out: 2050 [Urine:2050]  Intake/Output this shift:    Weight change: -1.1 kg (-2 lb 6.8 oz) OZH:YQMVH, kyphotic WF inNAD CVS:IRR Resp:CTA QIO:NGEXBM Ext: minimal pedal edema.   Lab 11/23/11 0516 11/22/11 0501 11/20/11 1604 11/20/11 0440 11/19/11 1450 11/19/11 0507 11/18/11 0444 11/17/11 0350  NA 124* 122* 121* 125* 124* 124* 128* --  K 3.4* 3.4* 4.4 3.7 3.5 2.5* 3.3* --  CL 91* 92* 93* 94* 90* 90* 97 --  CO2 25 24 22 23 24 26 25  --  GLUCOSE 91 100* 124* 105* 155* 112* 111* --  BUN 19 12 10 11 12 11 21  --  CREATININE 0.50 0.42* 0.39* 0.40* 0.44* 0.44* 0.59 --  ALBUMIN 2.2* 2.2* -- 2.4* -- -- -- 2.6*  CALCIUM 8.7 8.4 8.1* 8.3* 8.4 8.2* 7.9* --  PHOS 3.0 2.3 -- 1.3* -- -- -- --  AST -- -- -- -- -- -- -- 52*  ALT -- -- -- -- -- -- -- 24   Liver Function Tests:  Lab 11/23/11 0516 11/22/11 0501 11/20/11 0440 11/17/11 0350  AST -- -- -- 52*  ALT -- -- -- 24  ALKPHOS -- -- -- 40  BILITOT -- -- -- 0.4  PROT -- -- -- 4.9*  ALBUMIN 2.2* 2.2* 2.4* --   No results found for this basename: LIPASE:3,AMYLASE:3 in the last 168 hours No results found for this basename: AMMONIA:3 in the last 168 hours CBC:  Lab 11/23/11 0516 11/22/11 0501 11/19/11 0507 11/19/11 0005 11/18/11 0444 11/16/11 1059  WBC 8.6 10.5 9.6 -- -- --  NEUTROABS -- -- -- -- -- 18.0*  HGB 10.6* 10.4* 10.9* -- -- --  HCT 30.1* 29.7* 30.7* -- -- --  MCV 86.5 86.6 85.0 84.5 84.0 --  PLT 254 226 115* -- -- --   Cardiac Enzymes:  Lab 11/16/11 1059    CKTOTAL --  CKMB --  CKMBINDEX --  TROPONINI <0.30   CBG: No results found for this basename: GLUCAP:5 in the last 168 hours  Iron Studies: No results found for this basename: IRON,TIBC,TRANSFERRIN,FERRITIN in the last 72 hours Studies/Results: Dg Chest 2 View  11/22/2011  *RADIOLOGY REPORT*  Clinical Data: Hyponatremia.  Possible SIADH.  CHEST - 2 VIEW  Comparison: Acute abdominal series 11/16/2011.  Findings: Lungs appear hyperexpanded with increased retrosternal air space and pruning of the pulmonary vasculature in the periphery, compatible with underlying COPD.  Bibasilar opacities are noted, which are favored to reflects subsegmental atelectasis with superimposed small bilateral pleural effusions (left greater than right).  No definite consolidative airspace disease.  No definite suspicious appearing pulmonary nodules or masses are identified by plain film examination.  Numerous calcified mediastinal and left hilar lymph nodes.  The heart size is mildly enlarged.  Mediastinal contours are distorted by patient positioning.  Atherosclerosis of the thoracic aorta.  IMPRESSION: 1.  No definite suspicious appearing pulmonary nodule or  mass.  If there is clinical concern for malignancy despite this negative radiograph, further evaluation with chest CT may be warranted (chest CTs are far more sensitive and specific). 2.  Probable bibasilar subsegmental atelectasis with superimposed small bilateral pleural effusions (left greater than right). 3.  Atherosclerosis.   Original Report Authenticated By: Trudie Reed, M.D.       . calcium carbonate  1 tablet Oral Daily  . cholecalciferol  1,000 Units Oral Daily  . docusate sodium  100 mg Oral BID  . feeding supplement  1 Container Oral BID BM  . furosemide  20 mg Intravenous Daily  . protein supplement  6 g Oral TID WC  . [DISCONTINUED] protein supplement  1 scoop Oral TID WC    BMET    Component Value Date/Time   NA 124* 11/23/2011 0516   K  3.4* 11/23/2011 0516   CL 91* 11/23/2011 0516   CO2 25 11/23/2011 0516   GLUCOSE 91 11/23/2011 0516   BUN 19 11/23/2011 0516   CREATININE 0.50 11/23/2011 0516   CALCIUM 8.7 11/23/2011 0516   GFRNONAA 80* 11/23/2011 0516   GFRAA >90 11/23/2011 0516   CBC    Component Value Date/Time   WBC 8.6 11/23/2011 0516   RBC 3.48* 11/23/2011 0516   HGB 10.6* 11/23/2011 0516   HCT 30.1* 11/23/2011 0516   PLT 254 11/23/2011 0516   MCV 86.5 11/23/2011 0516   MCH 30.5 11/23/2011 0516   MCHC 35.2 11/23/2011 0516   RDW 15.5 11/23/2011 0516   LYMPHSABS 1.2 11/16/2011 1059   MONOABS 1.4* 11/16/2011 1059   EOSABS 0.0 11/16/2011 1059   BASOSABS 0.0 11/16/2011 1059    Assessment/Plan:  1. Hyponatremia- pt most likely was volume depleted upon admission, however she now has evidence of volume overload with pedal edema. Will discontinue IVF's and start lasix. Her Uosm was inappropriately high so SIADH is still Harrison possibility. CXR negative for malignancy but may require CT to be more certain if Na doesn't cont to improve.  Na trending upwards and will cont with lasix and fluid restriction and follow serum sodium closely. No indication for tolvaptan at this time. Pt also appears malnourished and poor solute intake may also account for hyponatremia. Will increase lasix to 20 bid and see if this will raise NA as her UOP trailed off. 2. Bilateral pubic rami fx- per Ortho and primary svc 3. Protein and caloric malnutrition- encourage increased protein intake and will start beneprotein. 4. ABLA- improved after blood transfusion. Cont to follow 5. Iron deficiency- repleted after blood transfusion 6. Dementia/memory impairment- unclear if this is baseline or worse due to hyponatremia/acute illness. Cont to follow 7. Hypokalemia- likely due to poor nutrition. Will also check Mg level. Cont to replete. 8. Dispo- was at [facility] but will likely need SNF placement. 9.   Sharon Harrison

## 2011-11-23 NOTE — Progress Notes (Signed)
Subjective: PT states she feels much better today.  Interval history: Pt continues to be in sinus rhythm. She's had some stabilization of her sodium and continues to have improved volume status.  Objective: Filed Vitals:   11/22/11 0535 11/22/11 1425 11/22/11 2235 11/23/11 0502  BP: 150/85 112/74 136/78 137/73  Pulse: 102 106 106 78  Temp: 98.2 F (36.8 C) 98.5 F (36.9 C) 97.9 F (36.6 C) 97.7 F (36.5 C)  TempSrc: Oral Oral Oral Oral  Resp: 18 20 20 20   Height:      Weight: 52.9 kg (116 lb 10 oz)   51.8 kg (114 lb 3.2 oz)  SpO2: 98% 98% 96% 96%   Weight change: -1.1 kg (-2 lb 6.8 oz)  Intake/Output Summary (Last 24 hours) at 11/23/11 1034 Last data filed at 11/23/11 0600  Gross per 24 hour  Intake    220 ml  Output   1000 ml  Net   -780 ml    General: Alert, awake, oriented x3, in no acute distress. . Today pt is much more energetic and states that she feels well.  HEENT: /AT PEERL, EOMI Neck: Trachea midline,  no masses, no thyromegal,y no JVD, no carotid bruit OROPHARYNX:  Moist, No exudate/ erythema/lesions.  Heart: Regular rate and rhythm, without murmurs, rubs, gallops, PMI non-displaced, no heaves or thrills on palpation.  Lungs: Clear to auscultation, no wheezing or rhonchi noted. No increased vocal fremitus resonant to percussion  Abdomen: Soft, nontender, nondistended, positive bowel sounds, no masses no hepatosplenomegaly noted..  Neuro: No focal neurological deficits noted.  Musculoskeletal: Decreased edema bilaterally in lower extremity   Lab Results:  Basename 11/23/11 0516 11/22/11 0501 11/21/11 1046  NA 124* 122* --  K 3.4* 3.4* --  CL 91* 92* --  CO2 25 24 --  GLUCOSE 91 100* --  BUN 19 12 --  CREATININE 0.50 0.42* --  CALCIUM 8.7 8.4 --  MG 1.7 -- 1.8  PHOS 3.0 2.3 --    Basename 11/23/11 0516 11/22/11 0501  AST -- --  ALT -- --  ALKPHOS -- --  BILITOT -- --  PROT -- --  ALBUMIN 2.2* 2.2*   No results found for this basename:  LIPASE:2,AMYLASE:2 in the last 72 hours  Basename 11/23/11 0516 11/22/11 0501  WBC 8.6 10.5  NEUTROABS -- --  HGB 10.6* 10.4*  HCT 30.1* 29.7*  MCV 86.5 86.6  PLT 254 226   No results found for this basename: CKTOTAL:3,CKMB:3,CKMBINDEX:3,TROPONINI:3 in the last 72 hours No components found with this basename: POCBNP:3 No results found for this basename: DDIMER:2 in the last 72 hours No results found for this basename: HGBA1C:2 in the last 72 hours No results found for this basename: CHOL:2,HDL:2,LDLCALC:2,TRIG:2,CHOLHDL:2,LDLDIRECT:2 in the last 72 hours  Basename 11/21/11 1046  TSH 3.826  T4TOTAL --  T3FREE --  THYROIDAB --   No results found for this basename: VITAMINB12:2,FOLATE:2,FERRITIN:2,TIBC:2,IRON:2,RETICCTPCT:2 in the last 72 hours  Micro Results: Recent Results (from the past 240 hour(s))  MRSA PCR SCREENING     Status: Normal   Collection Time   11/16/11  9:29 PM      Component Value Range Status Comment   MRSA by PCR NEGATIVE  NEGATIVE Final     Studies/Results: Ct Head Wo Contrast  11/16/2011  *RADIOLOGY REPORT*  Clinical Data:  78 year old female with headache and neck pain following fall.  CT HEAD WITHOUT CONTRAST CT CERVICAL SPINE WITHOUT CONTRAST  Technique:  Multidetector CT imaging of the head and cervical  spine was performed following the standard protocol without intravenous contrast.  Multiplanar CT image reconstructions of the cervical spine were also generated.  Comparison:  05/22/2005.  CT HEAD  Findings: Atrophy and chronic small vessel white matter ischemic changes are again identified.  No acute intracranial abnormalities are identified, including mass lesion or mass effect, hydrocephalus, extra-axial fluid collection, midline shift, hemorrhage, or acute infarction.  The visualized bony calvarium is unremarkable.  IMPRESSION: No evidence of acute intracranial abnormality.  Atrophy and chronic small vessel white matter ischemic changes.  CT CERVICAL  SPINE  Findings: 1.5 mm retrolisthesis of C5 in relation to C4-C6 is identified, almost certainly degenerative. There is no evidence of acute fracture or prevertebral soft tissue swelling. Diffuse osteopenia and moderate facet arthropathy throughout the cervical spine noted. Severe degenerative disc disease at C4-C5 an C5-C6 noted. No focal bony lesions are present. No soft tissue abnormalities are noted.  IMPRESSION: No static evidence of acute injury to the cervical spine.  Mild retrolisthesis of C5 with severe degenerative changes at C4-C5 and C5-C6.  Diffuse osteopenia.   Original Report Authenticated By: Rosendo Gros, M.D.    Ct Cervical Spine Wo Contrast  11/16/2011  *RADIOLOGY REPORT*  Clinical Data:  78 year old female with headache and neck pain following fall.  CT HEAD WITHOUT CONTRAST CT CERVICAL SPINE WITHOUT CONTRAST  Technique:  Multidetector CT imaging of the head and cervical spine was performed following the standard protocol without intravenous contrast.  Multiplanar CT image reconstructions of the cervical spine were also generated.  Comparison:  05/22/2005.  CT HEAD  Findings: Atrophy and chronic small vessel white matter ischemic changes are again identified.  No acute intracranial abnormalities are identified, including mass lesion or mass effect, hydrocephalus, extra-axial fluid collection, midline shift, hemorrhage, or acute infarction.  The visualized bony calvarium is unremarkable.  IMPRESSION: No evidence of acute intracranial abnormality.  Atrophy and chronic small vessel white matter ischemic changes.  CT CERVICAL SPINE  Findings: 1.5 mm retrolisthesis of C5 in relation to C4-C6 is identified, almost certainly degenerative. There is no evidence of acute fracture or prevertebral soft tissue swelling. Diffuse osteopenia and moderate facet arthropathy throughout the cervical spine noted. Severe degenerative disc disease at C4-C5 an C5-C6 noted. No focal bony lesions are present. No  soft tissue abnormalities are noted.  IMPRESSION: No static evidence of acute injury to the cervical spine.  Mild retrolisthesis of C5 with severe degenerative changes at C4-C5 and C5-C6.  Diffuse osteopenia.   Original Report Authenticated By: Rosendo Gros, M.D.    Ct Abdomen Pelvis W Contrast  11/16/2011  *RADIOLOGY REPORT*  Clinical Data: Pain post fall.  Possible pelvic fracture.  CT ABDOMEN AND PELVIS WITH CONTRAST  Technique:  Multidetector CT imaging of the abdomen and pelvis was performed following the standard protocol during bolus administration of intravenous contrast.  Contrast: OMNIPAQUE IOHEXOL 300 MG/ML  SOLN  Comparison: None.  Findings: Linear scarring or subsegmental atelectasis posteriorly in the visualized lung bases.  Calcified granuloma in the inferior lingula.  Sub centimeter subcapsular probable cyst in the medial left hepatic segment.  Otherwise unremarkable liver, gallbladder, spleen, adrenal glands, right kidney.  There is a small probable cyst in the upper pole left kidney.  No hydronephrosis.  Stomach and small bowel are nondistended.  Moderate fecal material in the colon. No definite ascites. No free air.  Minimally-displaced fractures of bilateral superior and inferior ischiopubic rami, comminuted and displaced on the left.  There is a moderately large  prevesical, extraperitoneal, left retroperitoneal/presacral and anterior body wall hematoma.  There are at least three focal areas of probable active extravasation noted, two just cephalad to the left superior ischiopubic ramus, the third  posterior to the right superior pubic ramus.  There is a minimally-displaced fracture through the left sacral ala without definite involvement of the sacral foramina.  Fixation hardware across the left femoral neck is partially seen. Progressive vertebral compression fracture deformities of T11, T12, L1, L2, and L4 since previous films of 03/11/2008.  There is mild retropulsion into the  spinal canal at T11, T12, L1, and L2.  No displaced posterior element fractures evident.  IMPRESSION:  1.  Acute fractures of superior and inferior ischiopubic rami, with a moderately large pelvic hematoma and evidence of bilateral active extravasation. I telephoned the critical test results to Dr. Fredderick Phenix at the time of interpretation. 2.  Minimally-displaced fracture of the left sacral ala. 3.  Multiple thoracolumbar compression fracture deformities, progressive since 03/11/2008.   Original Report Authenticated By: Osa Craver, M.D.    Ir Angiogram Pelvis Selective Or Supraselective  11/18/2011  *RADIOLOGY REPORT*  Clinical Data: 78 year old female with multiple pelvic fractures, pelvic hematoma, evidence of active bilateral extravasation in the anterior pelvis on CT.  She is currently hemodynamically stable.  PELVIC SELECTIVE ARTERIOGRAPHY,IR ULTRASOUND GUIDANCE VASC ACCESS RIGHT,TRANSCATHETER THERAPY EMBOLIZATION,ARTERIOGRAPHY,ADDITIONAL ARTERIOGRAPHY  Comparison: None.  Approach:  Right common femoral artery  Vessels catheterized:  Anterior branch left internal iliac artery (third order), anterior branch right internal iliac artery (second order)  Technique and findings: The procedure, risks (including but not limited to bleeding, infection, organ damage), benefits, and alternatives were explained to the patient and daughter.  Questions regarding the procedure were encouraged and answered.  The patient understands and consents to the procedure.The right femoral region was prepped and draped usual sterile fashion. Maximal barrier sterile technique was utilized including caps, mask, sterile gowns, sterile gloves, sterile drape, hand hygiene and skin antiseptic.  Intravenous Fentanyl and Versed were administered as conscious sedation during continuous cardiorespiratory monitoring by the radiology RN, with a total moderate sedation time of 120 minutes.  Under real time ultrasound guidance, the right  common femoral artery was accessed with a 21-gauge micropuncture needle.  This was exchanged over a 018 guide wire for a transitional dilator, which allowed placement of a Benson wire into the abdominal aorta.  Over this, a 6-French vascular sheath was placed.  Through this, a 5- [place] C2 catheter was advanced in attempts to traverse the aortic bifurcation but because of the relatively acute angulation tortuosity, this was unsuccessful.  Aortography at the level of the bifurcation demonstrates mild tortuosity of the distal abdominal aorta   and common iliac arteries without significant atheromatous irregularity, dissection, or stenosis.  The C2 was exchanged for a Sos catheter, used to place a guide wire across the aortic bifurcation.  Over this, the C2 catheter was advanced.  After selective left internal iliac arteriography in multiple projections, an anterior branch of the internal iliac system supplying the region of active extravasation seen on CT was identified and selectively catheterized with the C2 catheter.  Gelfoam slurry embolization was performed.  Follow-up left internal iliac arteriography demonstrates  cessation of flow into the treated segment, no evident complication.  No active extravasation.  In similar fashion, the C2 catheter was directed into   the right internal iliac artery. Selective right pelvic arteriography in multiple projections was obtained.  An anterior branch supplying the region of active extravasation  seen on CT was identified. The branch was selectively catheterized and embolization  performed with a Gelfoam slurry.  Follow-up   right internal iliac arteriography demonstrates cessation of flow in the treated segment.  No evidence of active extravasation or other apparent complication.  The catheter was then removed.  After confirmatory right femoral arteriography, the sheath was removed and hemostasis achieved with the Exoseal device. The patient tolerated the procedure well and  remained hemodynamically stable.  No immediate complication.  IMPRESSION: 1.  Technically successful Gelfoam embolization of the anterior division left internal iliac artery. 2.  Technically successful Gelfoam embolization of the anterior vision right internal iliac artery.   Original Report Authenticated By: Osa Craver, M.D.    Ir Angiogram Pelvis Selective Or Supraselective  11/18/2011  *RADIOLOGY REPORT*  Clinical Data: 78 year old female with multiple pelvic fractures, pelvic hematoma, evidence of active bilateral extravasation in the anterior pelvis on CT.  She is currently hemodynamically stable.  PELVIC SELECTIVE ARTERIOGRAPHY,IR ULTRASOUND GUIDANCE VASC ACCESS RIGHT,TRANSCATHETER THERAPY EMBOLIZATION,ARTERIOGRAPHY,ADDITIONAL ARTERIOGRAPHY  Comparison: None.  Approach:  Right common femoral artery  Vessels catheterized:  Anterior branch left internal iliac artery (third order), anterior branch right internal iliac artery (second order)  Technique and findings: The procedure, risks (including but not limited to bleeding, infection, organ damage), benefits, and alternatives were explained to the patient and daughter.  Questions regarding the procedure were encouraged and answered.  The patient understands and consents to the procedure.The right femoral region was prepped and draped usual sterile fashion. Maximal barrier sterile technique was utilized including caps, mask, sterile gowns, sterile gloves, sterile drape, hand hygiene and skin antiseptic.  Intravenous Fentanyl and Versed were administered as conscious sedation during continuous cardiorespiratory monitoring by the radiology RN, with a total moderate sedation time of 120 minutes.  Under real time ultrasound guidance, the right common femoral artery was accessed with a 21-gauge micropuncture needle.  This was exchanged over a 018 guide wire for a transitional dilator, which allowed placement of a Benson wire into the abdominal aorta.   Over this, a 6-French vascular sheath was placed.  Through this, a 5- [place] C2 catheter was advanced in attempts to traverse the aortic bifurcation but because of the relatively acute angulation tortuosity, this was unsuccessful.  Aortography at the level of the bifurcation demonstrates mild tortuosity of the distal abdominal aorta   and common iliac arteries without significant atheromatous irregularity, dissection, or stenosis.  The C2 was exchanged for a Sos catheter, used to place a guide wire across the aortic bifurcation.  Over this, the C2 catheter was advanced.  After selective left internal iliac arteriography in multiple projections, an anterior branch of the internal iliac system supplying the region of active extravasation seen on CT was identified and selectively catheterized with the C2 catheter.  Gelfoam slurry embolization was performed.  Follow-up left internal iliac arteriography demonstrates  cessation of flow into the treated segment, no evident complication.  No active extravasation.  In similar fashion, the C2 catheter was directed into   the right internal iliac artery. Selective right pelvic arteriography in multiple projections was obtained.  An anterior branch supplying the region of active extravasation seen on CT was identified. The branch was selectively catheterized and embolization  performed with a Gelfoam slurry.  Follow-up   right internal iliac arteriography demonstrates cessation of flow in the treated segment.  No evidence of active extravasation or other apparent complication.  The catheter was then removed.  After confirmatory right femoral arteriography,  the sheath was removed and hemostasis achieved with the Exoseal device. The patient tolerated the procedure well and remained hemodynamically stable.  No immediate complication.  IMPRESSION: 1.  Technically successful Gelfoam embolization of the anterior division left internal iliac artery. 2.  Technically successful Gelfoam  embolization of the anterior vision right internal iliac artery.   Original Report Authenticated By: Osa Craver, M.D.    Ir Angiogram Selective Each Additional Vessel  11/18/2011  *RADIOLOGY REPORT*  Clinical Data: 78 year old female with multiple pelvic fractures, pelvic hematoma, evidence of active bilateral extravasation in the anterior pelvis on CT.  She is currently hemodynamically stable.  PELVIC SELECTIVE ARTERIOGRAPHY,IR ULTRASOUND GUIDANCE VASC ACCESS RIGHT,TRANSCATHETER THERAPY EMBOLIZATION,ARTERIOGRAPHY,ADDITIONAL ARTERIOGRAPHY  Comparison: None.  Approach:  Right common femoral artery  Vessels catheterized:  Anterior branch left internal iliac artery (third order), anterior branch right internal iliac artery (second order)  Technique and findings: The procedure, risks (including but not limited to bleeding, infection, organ damage), benefits, and alternatives were explained to the patient and daughter.  Questions regarding the procedure were encouraged and answered.  The patient understands and consents to the procedure.The right femoral region was prepped and draped usual sterile fashion. Maximal barrier sterile technique was utilized including caps, mask, sterile gowns, sterile gloves, sterile drape, hand hygiene and skin antiseptic.  Intravenous Fentanyl and Versed were administered as conscious sedation during continuous cardiorespiratory monitoring by the radiology RN, with a total moderate sedation time of 120 minutes.  Under real time ultrasound guidance, the right common femoral artery was accessed with a 21-gauge micropuncture needle.  This was exchanged over a 018 guide wire for a transitional dilator, which allowed placement of a Benson wire into the abdominal aorta.  Over this, a 6-French vascular sheath was placed.  Through this, a 5- [place] C2 catheter was advanced in attempts to traverse the aortic bifurcation but because of the relatively acute angulation tortuosity, this  was unsuccessful.  Aortography at the level of the bifurcation demonstrates mild tortuosity of the distal abdominal aorta   and common iliac arteries without significant atheromatous irregularity, dissection, or stenosis.  The C2 was exchanged for a Sos catheter, used to place a guide wire across the aortic bifurcation.  Over this, the C2 catheter was advanced.  After selective left internal iliac arteriography in multiple projections, an anterior branch of the internal iliac system supplying the region of active extravasation seen on CT was identified and selectively catheterized with the C2 catheter.  Gelfoam slurry embolization was performed.  Follow-up left internal iliac arteriography demonstrates  cessation of flow into the treated segment, no evident complication.  No active extravasation.  In similar fashion, the C2 catheter was directed into   the right internal iliac artery. Selective right pelvic arteriography in multiple projections was obtained.  An anterior branch supplying the region of active extravasation seen on CT was identified. The branch was selectively catheterized and embolization  performed with a Gelfoam slurry.  Follow-up   right internal iliac arteriography demonstrates cessation of flow in the treated segment.  No evidence of active extravasation or other apparent complication.  The catheter was then removed.  After confirmatory right femoral arteriography, the sheath was removed and hemostasis achieved with the Exoseal device. The patient tolerated the procedure well and remained hemodynamically stable.  No immediate complication.  IMPRESSION: 1.  Technically successful Gelfoam embolization of the anterior division left internal iliac artery. 2.  Technically successful Gelfoam embolization of the anterior vision right internal iliac artery.  Original Report Authenticated By: Osa Craver, M.D.    Ir Angiogram Selective Each Additional Vessel  11/18/2011  *RADIOLOGY REPORT*   Clinical Data: 78 year old female with multiple pelvic fractures, pelvic hematoma, evidence of active bilateral extravasation in the anterior pelvis on CT.  She is currently hemodynamically stable.  PELVIC SELECTIVE ARTERIOGRAPHY,IR ULTRASOUND GUIDANCE VASC ACCESS RIGHT,TRANSCATHETER THERAPY EMBOLIZATION,ARTERIOGRAPHY,ADDITIONAL ARTERIOGRAPHY  Comparison: None.  Approach:  Right common femoral artery  Vessels catheterized:  Anterior branch left internal iliac artery (third order), anterior branch right internal iliac artery (second order)  Technique and findings: The procedure, risks (including but not limited to bleeding, infection, organ damage), benefits, and alternatives were explained to the patient and daughter.  Questions regarding the procedure were encouraged and answered.  The patient understands and consents to the procedure.The right femoral region was prepped and draped usual sterile fashion. Maximal barrier sterile technique was utilized including caps, mask, sterile gowns, sterile gloves, sterile drape, hand hygiene and skin antiseptic.  Intravenous Fentanyl and Versed were administered as conscious sedation during continuous cardiorespiratory monitoring by the radiology RN, with a total moderate sedation time of 120 minutes.  Under real time ultrasound guidance, the right common femoral artery was accessed with a 21-gauge micropuncture needle.  This was exchanged over a 018 guide wire for a transitional dilator, which allowed placement of a Benson wire into the abdominal aorta.  Over this, a 6-French vascular sheath was placed.  Through this, a 5- [place] C2 catheter was advanced in attempts to traverse the aortic bifurcation but because of the relatively acute angulation tortuosity, this was unsuccessful.  Aortography at the level of the bifurcation demonstrates mild tortuosity of the distal abdominal aorta   and common iliac arteries without significant atheromatous irregularity, dissection, or  stenosis.  The C2 was exchanged for a Sos catheter, used to place a guide wire across the aortic bifurcation.  Over this, the C2 catheter was advanced.  After selective left internal iliac arteriography in multiple projections, an anterior branch of the internal iliac system supplying the region of active extravasation seen on CT was identified and selectively catheterized with the C2 catheter.  Gelfoam slurry embolization was performed.  Follow-up left internal iliac arteriography demonstrates  cessation of flow into the treated segment, no evident complication.  No active extravasation.  In similar fashion, the C2 catheter was directed into   the right internal iliac artery. Selective right pelvic arteriography in multiple projections was obtained.  An anterior branch supplying the region of active extravasation seen on CT was identified. The branch was selectively catheterized and embolization  performed with a Gelfoam slurry.  Follow-up   right internal iliac arteriography demonstrates cessation of flow in the treated segment.  No evidence of active extravasation or other apparent complication.  The catheter was then removed.  After confirmatory right femoral arteriography, the sheath was removed and hemostasis achieved with the Exoseal device. The patient tolerated the procedure well and remained hemodynamically stable.  No immediate complication.  IMPRESSION: 1.  Technically successful Gelfoam embolization of the anterior division left internal iliac artery. 2.  Technically successful Gelfoam embolization of the anterior vision right internal iliac artery.   Original Report Authenticated By: Osa Craver, M.D.    Ir Transcath/emboliz  11/18/2011  *RADIOLOGY REPORT*  Clinical Data: 78 year old female with multiple pelvic fractures, pelvic hematoma, evidence of active bilateral extravasation in the anterior pelvis on CT.  She is currently hemodynamically stable.  PELVIC SELECTIVE ARTERIOGRAPHY,IR  ULTRASOUND GUIDANCE VASC ACCESS  RIGHT,TRANSCATHETER THERAPY EMBOLIZATION,ARTERIOGRAPHY,ADDITIONAL ARTERIOGRAPHY  Comparison: None.  Approach:  Right common femoral artery  Vessels catheterized:  Anterior branch left internal iliac artery (third order), anterior branch right internal iliac artery (second order)  Technique and findings: The procedure, risks (including but not limited to bleeding, infection, organ damage), benefits, and alternatives were explained to the patient and daughter.  Questions regarding the procedure were encouraged and answered.  The patient understands and consents to the procedure.The right femoral region was prepped and draped usual sterile fashion. Maximal barrier sterile technique was utilized including caps, mask, sterile gowns, sterile gloves, sterile drape, hand hygiene and skin antiseptic.  Intravenous Fentanyl and Versed were administered as conscious sedation during continuous cardiorespiratory monitoring by the radiology RN, with a total moderate sedation time of 120 minutes.  Under real time ultrasound guidance, the right common femoral artery was accessed with a 21-gauge micropuncture needle.  This was exchanged over a 018 guide wire for a transitional dilator, which allowed placement of a Benson wire into the abdominal aorta.  Over this, a 6-French vascular sheath was placed.  Through this, a 5- [place] C2 catheter was advanced in attempts to traverse the aortic bifurcation but because of the relatively acute angulation tortuosity, this was unsuccessful.  Aortography at the level of the bifurcation demonstrates mild tortuosity of the distal abdominal aorta   and common iliac arteries without significant atheromatous irregularity, dissection, or stenosis.  The C2 was exchanged for a Sos catheter, used to place a guide wire across the aortic bifurcation.  Over this, the C2 catheter was advanced.  After selective left internal iliac arteriography in multiple projections, an  anterior branch of the internal iliac system supplying the region of active extravasation seen on CT was identified and selectively catheterized with the C2 catheter.  Gelfoam slurry embolization was performed.  Follow-up left internal iliac arteriography demonstrates  cessation of flow into the treated segment, no evident complication.  No active extravasation.  In similar fashion, the C2 catheter was directed into   the right internal iliac artery. Selective right pelvic arteriography in multiple projections was obtained.  An anterior branch supplying the region of active extravasation seen on CT was identified. The branch was selectively catheterized and embolization  performed with a Gelfoam slurry.  Follow-up   right internal iliac arteriography demonstrates cessation of flow in the treated segment.  No evidence of active extravasation or other apparent complication.  The catheter was then removed.  After confirmatory right femoral arteriography, the sheath was removed and hemostasis achieved with the Exoseal device. The patient tolerated the procedure well and remained hemodynamically stable.  No immediate complication.  IMPRESSION: 1.  Technically successful Gelfoam embolization of the anterior division left internal iliac artery. 2.  Technically successful Gelfoam embolization of the anterior vision right internal iliac artery.   Original Report Authenticated By: Osa Craver, M.D.    Ir Transcath/emboliz  11/18/2011  *RADIOLOGY REPORT*  Clinical Data: 78 year old female with multiple pelvic fractures, pelvic hematoma, evidence of active bilateral extravasation in the anterior pelvis on CT.  She is currently hemodynamically stable.  PELVIC SELECTIVE ARTERIOGRAPHY,IR ULTRASOUND GUIDANCE VASC ACCESS RIGHT,TRANSCATHETER THERAPY EMBOLIZATION,ARTERIOGRAPHY,ADDITIONAL ARTERIOGRAPHY  Comparison: None.  Approach:  Right common femoral artery  Vessels catheterized:  Anterior branch left internal iliac  artery (third order), anterior branch right internal iliac artery (second order)  Technique and findings: The procedure, risks (including but not limited to bleeding, infection, organ damage), benefits, and alternatives were explained to the patient and daughter.  Questions regarding the procedure were encouraged and answered.  The patient understands and consents to the procedure.The right femoral region was prepped and draped usual sterile fashion. Maximal barrier sterile technique was utilized including caps, mask, sterile gowns, sterile gloves, sterile drape, hand hygiene and skin antiseptic.  Intravenous Fentanyl and Versed were administered as conscious sedation during continuous cardiorespiratory monitoring by the radiology RN, with a total moderate sedation time of 120 minutes.  Under real time ultrasound guidance, the right common femoral artery was accessed with a 21-gauge micropuncture needle.  This was exchanged over a 018 guide wire for a transitional dilator, which allowed placement of a Benson wire into the abdominal aorta.  Over this, a 6-French vascular sheath was placed.  Through this, a 5- [place] C2 catheter was advanced in attempts to traverse the aortic bifurcation but because of the relatively acute angulation tortuosity, this was unsuccessful.  Aortography at the level of the bifurcation demonstrates mild tortuosity of the distal abdominal aorta   and common iliac arteries without significant atheromatous irregularity, dissection, or stenosis.  The C2 was exchanged for a Sos catheter, used to place a guide wire across the aortic bifurcation.  Over this, the C2 catheter was advanced.  After selective left internal iliac arteriography in multiple projections, an anterior branch of the internal iliac system supplying the region of active extravasation seen on CT was identified and selectively catheterized with the C2 catheter.  Gelfoam slurry embolization was performed.  Follow-up left internal  iliac arteriography demonstrates  cessation of flow into the treated segment, no evident complication.  No active extravasation.  In similar fashion, the C2 catheter was directed into   the right internal iliac artery. Selective right pelvic arteriography in multiple projections was obtained.  An anterior branch supplying the region of active extravasation seen on CT was identified. The branch was selectively catheterized and embolization  performed with a Gelfoam slurry.  Follow-up   right internal iliac arteriography demonstrates cessation of flow in the treated segment.  No evidence of active extravasation or other apparent complication.  The catheter was then removed.  After confirmatory right femoral arteriography, the sheath was removed and hemostasis achieved with the Exoseal device. The patient tolerated the procedure well and remained hemodynamically stable.  No immediate complication.  IMPRESSION: 1.  Technically successful Gelfoam embolization of the anterior division left internal iliac artery. 2.  Technically successful Gelfoam embolization of the anterior vision right internal iliac artery.   Original Report Authenticated By: Osa Craver, M.D.    Ir Angiogram Follow Up Study  11/18/2011  *RADIOLOGY REPORT*  Clinical Data: 78 year old female with multiple pelvic fractures, pelvic hematoma, evidence of active bilateral extravasation in the anterior pelvis on CT.  She is currently hemodynamically stable.  PELVIC SELECTIVE ARTERIOGRAPHY,IR ULTRASOUND GUIDANCE VASC ACCESS RIGHT,TRANSCATHETER THERAPY EMBOLIZATION,ARTERIOGRAPHY,ADDITIONAL ARTERIOGRAPHY  Comparison: None.  Approach:  Right common femoral artery  Vessels catheterized:  Anterior branch left internal iliac artery (third order), anterior branch right internal iliac artery (second order)  Technique and findings: The procedure, risks (including but not limited to bleeding, infection, organ damage), benefits, and alternatives were  explained to the patient and daughter.  Questions regarding the procedure were encouraged and answered.  The patient understands and consents to the procedure.The right femoral region was prepped and draped usual sterile fashion. Maximal barrier sterile technique was utilized including caps, mask, sterile gowns, sterile gloves, sterile drape, hand hygiene and skin antiseptic.  Intravenous Fentanyl and Versed were administered as conscious sedation  during continuous cardiorespiratory monitoring by the radiology RN, with a total moderate sedation time of 120 minutes.  Under real time ultrasound guidance, the right common femoral artery was accessed with a 21-gauge micropuncture needle.  This was exchanged over a 018 guide wire for a transitional dilator, which allowed placement of a Benson wire into the abdominal aorta.  Over this, a 6-French vascular sheath was placed.  Through this, a 5- [place] C2 catheter was advanced in attempts to traverse the aortic bifurcation but because of the relatively acute angulation tortuosity, this was unsuccessful.  Aortography at the level of the bifurcation demonstrates mild tortuosity of the distal abdominal aorta   and common iliac arteries without significant atheromatous irregularity, dissection, or stenosis.  The C2 was exchanged for a Sos catheter, used to place a guide wire across the aortic bifurcation.  Over this, the C2 catheter was advanced.  After selective left internal iliac arteriography in multiple projections, an anterior branch of the internal iliac system supplying the region of active extravasation seen on CT was identified and selectively catheterized with the C2 catheter.  Gelfoam slurry embolization was performed.  Follow-up left internal iliac arteriography demonstrates  cessation of flow into the treated segment, no evident complication.  No active extravasation.  In similar fashion, the C2 catheter was directed into   the right internal iliac artery.  Selective right pelvic arteriography in multiple projections was obtained.  An anterior branch supplying the region of active extravasation seen on CT was identified. The branch was selectively catheterized and embolization  performed with a Gelfoam slurry.  Follow-up   right internal iliac arteriography demonstrates cessation of flow in the treated segment.  No evidence of active extravasation or other apparent complication.  The catheter was then removed.  After confirmatory right femoral arteriography, the sheath was removed and hemostasis achieved with the Exoseal device. The patient tolerated the procedure well and remained hemodynamically stable.  No immediate complication.  IMPRESSION: 1.  Technically successful Gelfoam embolization of the anterior division left internal iliac artery. 2.  Technically successful Gelfoam embolization of the anterior vision right internal iliac artery.   Original Report Authenticated By: Osa Craver, M.D.    Ir Angiogram Follow Up Study  11/16/2011  Dayne Oley Balm III, MD     11/16/2011  6:12 PM Bilateral internal iliac artery anterior branch  gelfoam  embolization No complication No blood loss. See complete dictation in [facility].    Ir US Guide Vasc Access Right  11/18/2011  *RADIOLOGY REPORT*  Clinical Data: 78 year old female with multiple pelvic fractures, pelvic hematoma, evidence of active bilateral extravasation in the anterior pelvis on CT.  She is currently hemodynamically stable.  PELVIC SELECTIVE ARTERIOGRAPHY,IR ULTRASOUND GUIDANCE VASC ACCESS RIGHT,TRANSCATHETER THERAPY EMBOLIZATION,ARTERIOGRAPHY,ADDITIONAL ARTERIOGRAPHY  Comparison: None.  Approach:  Right common femoral artery  Vessels catheterized:  Anterior branch left internal iliac artery (third order), anterior branch right internal iliac artery (second order)  Technique and findings: The procedure, risks (including but not limited to bleeding, infection, organ damage), benefits,  and alternatives were explained to the patient and daughter.  Questions regarding the procedure were encouraged and answered.  The patient understands and consents to the procedure.The right femoral region was prepped and draped usual sterile fashion. Maximal barrier sterile technique was utilized including caps, mask, sterile gowns, sterile gloves, sterile drape, hand hygiene and skin antiseptic.  Intravenous Fentanyl and Versed were administered as conscious sedation during continuous cardiorespiratory monitoring by the radiology RN, with a total  moderate sedation time of 120 minutes.  Under real time ultrasound guidance, the right common femoral artery was accessed with a 21-gauge micropuncture needle.  This was exchanged over a 018 guide wire for a transitional dilator, which allowed placement of a Benson wire into the abdominal aorta.  Over this, a 6-French vascular sheath was placed.  Through this, a 5- [place] C2 catheter was advanced in attempts to traverse the aortic bifurcation but because of the relatively acute angulation tortuosity, this was unsuccessful.  Aortography at the level of the bifurcation demonstrates mild tortuosity of the distal abdominal aorta   and common iliac arteries without significant atheromatous irregularity, dissection, or stenosis.  The C2 was exchanged for a Sos catheter, used to place a guide wire across the aortic bifurcation.  Over this, the C2 catheter was advanced.  After selective left internal iliac arteriography in multiple projections, an anterior branch of the internal iliac system supplying the region of active extravasation seen on CT was identified and selectively catheterized with the C2 catheter.  Gelfoam slurry embolization was performed.  Follow-up left internal iliac arteriography demonstrates  cessation of flow into the treated segment, no evident complication.  No active extravasation.  In similar fashion, the C2 catheter was directed into   the right internal  iliac artery. Selective right pelvic arteriography in multiple projections was obtained.  An anterior branch supplying the region of active extravasation seen on CT was identified. The branch was selectively catheterized and embolization  performed with a Gelfoam slurry.  Follow-up   right internal iliac arteriography demonstrates cessation of flow in the treated segment.  No evidence of active extravasation or other apparent complication.  The catheter was then removed.  After confirmatory right femoral arteriography, the sheath was removed and hemostasis achieved with the Exoseal device. The patient tolerated the procedure well and remained hemodynamically stable.  No immediate complication.  IMPRESSION: 1.  Technically successful Gelfoam embolization of the anterior division left internal iliac artery. 2.  Technically successful Gelfoam embolization of the anterior vision right internal iliac artery.   Original Report Authenticated By: Osa Craver, M.D.    Dg Abd Acute W/chest  11/16/2011  *RADIOLOGY REPORT*  Clinical Data: Abdominal distention, abdominal pain  ACUTE ABDOMEN SERIES (ABDOMEN 2 VIEW & CHEST 1 VIEW)  Comparison: Abdominal film 03/11/2008  Findings: Mild enlarged cardiac silhouette with ectatic aorta. There is no free air beneath hemidiaphragms.  Lungs are hyperinflated but clear.  The left lateral decubitus view demonstrates no intraperitoneal free air.  No dilated dilated loops of large or small bowel.  There is gas the rectum.  There is a left hip internal fixation.  There is irregularity along the superior pubic rami on the left.  IMPRESSION:  1.  No acute cardiopulmonary findings. 2.  No evidence of bowel obstruction or intraperitoneal free air. 3.  Concern for left pubic rami fracture.  Recommend dedicated views of the pelvis or CT for further evaluation.   Original Report Authenticated By: Genevive Bi, M.D.     Medications: I have reviewed the patient's current  medications. Scheduled Meds:    . calcium carbonate  1 tablet Oral Daily  . cholecalciferol  1,000 Units Oral Daily  . docusate sodium  100 mg Oral BID  . feeding supplement  1 Container Oral BID BM  . furosemide  20 mg Oral BID  . potassium chloride  20 mEq Oral BID  . protein supplement  6 g Oral TID WC  . [DISCONTINUED]  furosemide  20 mg Intravenous Daily   Continuous Infusions:    . sodium chloride 0.9 % 1,000 mL with potassium chloride 40 mEq infusion 10 mL/hr at 11/21/11 1131   PRN Meds:.acetaminophen, acetaminophen, albuterol, HYDROcodone-acetaminophen, morphine injection, ondansetron (ZOFRAN) IV, ondansetron, polyethylene glycol Assessment/Plan: Patient Active Hospital Problem List: Hyponatremia  Patient has very low protein stores. Her normal sodium of arrival to the hospital likely represents a hemoconcentrated state is. I suspect this patient has had some chronic hyponatremia. Her low protein stores at this point may make it very difficult for her to have effective diuresis for sodium concentration in the serum. However her sodium levels are improving with diuresis.  Pelvic hematoma, female (11/16/2011) PT is s/p gelfoam embolization  And s/p  Transfusion of 2 Units  PRBC's.  Hb is stable at present. Continue to monitor.  Bilateral pubic rami fractures (11/16/2011) Appreciate Ortho input. PT/OT. OOB as tolerated.   Anemia due to blood loss, acute (11/16/2011) S/PTransfusion of 2 Units  PRBC's.  Hb is stable at present. Continue to monitor.  Atrial Fibrillation Pt has converted back to sinus rhythm.  Hypokalemia:  Replace potassium as necessary.   Leukocytosis  Resolved  HTN (hypertension) BP adequately but not optimally controlled.  Protein Calorie Malnutrition Pt appears chronically malnourished and has had reported poor intake. Appreciate input from nutrition.      LOS: 7 days

## 2011-11-24 LAB — RENAL FUNCTION PANEL
BUN: 22 mg/dL (ref 6–23)
CO2: 28 mEq/L (ref 19–32)
GFR calc Af Amer: >90 mL/min (ref >90)
Glucose, Bld: 106 mg/dL — ABNORMAL HIGH (ref 70–99)
Potassium: 3.6 mEq/L (ref 3.5–5.1)
Sodium: 124 mEq/L — ABNORMAL LOW (ref 135–145)

## 2011-11-24 LAB — CBC
HCT: 31.1 % — ABNORMAL LOW (ref 36.0–46.0)
Hemoglobin: 10.9 g/dL — ABNORMAL LOW (ref 12.0–15.0)
RBC: 3.57 MIL/uL — ABNORMAL LOW (ref 3.87–5.11)
WBC: 8.5 10*3/uL (ref 4.0–10.5)

## 2011-11-24 LAB — KAPPA/LAMBDA LIGHT CHAINS: Lambda free light chains: 0.25 mg/dL — ABNORMAL LOW (ref 0.57–2.63)

## 2011-11-24 LAB — SODIUM: Sodium: 124 mEq/L — ABNORMAL LOW (ref 135–145)

## 2011-11-24 MED ORDER — TOLVAPTAN 15 MG PO TABS
15.0000 mg | ORAL_TABLET | ORAL | Status: DC
  Administered 2011-11-24: 15 mg via ORAL
  Filled 2011-11-24 (×2): qty 1

## 2011-11-24 MED ORDER — LISINOPRIL 10 MG PO TABS
10.0000 mg | ORAL_TABLET | Freq: Every day | ORAL | Status: DC
  Administered 2011-11-24 – 2011-11-25 (×2): 10 mg via ORAL
  Filled 2011-11-24 (×2): qty 1

## 2011-11-24 NOTE — Progress Notes (Signed)
Subjective: Alert, no complaint  Objective Vital signs in last 24 hours: Filed Vitals:   11/23/11 1438 11/23/11 2027 11/24/11 0453 11/24/11 1430  BP: 126/72 152/80 149/73 123/60  Pulse: 109 114 93 103  Temp: 98.9 F (37.2 C) 98.6 F (37 C) 98.4 F (36.9 C) 98.5 F (36.9 C)  TempSrc: Oral Oral Oral Oral  Resp: 20 18 18 18   Height:      Weight:   52.1 kg (114 lb 13.8 oz)   SpO2: 98% 98% 99% 98%   Weight change: 0.3 kg (10.6 oz)  Intake/Output Summary (Last 24 hours) at 11/24/11 1447 Last data filed at 11/24/11 1431  Gross per 24 hour  Intake    341 ml  Output    975 ml  Net   -634 ml   Labs: Basic Metabolic Panel:  Lab 11/24/11 9147 11/23/11 0516 11/22/11 0501 11/20/11 1604 11/20/11 0440 11/19/11 1450 11/19/11 0507  NA 124* 124* 122* 121* 125* 124* 124*  K 3.6 3.4* 3.4* 4.4 3.7 3.5 2.5*  CL 91* 91* 92* 93* 94* 90* 90*  CO2 28 25 24 22 23 24 26   GLUCOSE 106* 91 100* 124* 105* 155* 112*  BUN 22 19 12 10 11 12 11   CREATININE 0.54 0.50 0.42* 0.39* 0.40* 0.44* 0.44*  ALB -- -- -- -- -- -- --  CALCIUM 8.9 8.7 8.4 8.1* 8.3* 8.4 8.2*  PHOS 2.9 3.0 2.3 -- 1.3* -- --   Liver Function Tests:  Lab 11/24/11 0445 11/23/11 0516 11/22/11 0501  AST -- -- --  ALT -- -- --  ALKPHOS -- -- --  BILITOT -- -- --  PROT -- -- --  ALBUMIN 2.4* 2.2* 2.2*   No results found for this basename: LIPASE:3,AMYLASE:3 in the last 168 hours No results found for this basename: AMMONIA:3 in the last 168 hours CBC:  Lab 11/24/11 0446 11/23/11 0516 11/22/11 0501 11/19/11 0507  WBC 8.5 8.6 10.5 9.6  NEUTROABS -- -- -- --  HGB 10.9* 10.6* 10.4* 10.9*  HCT 31.1* 30.1* 29.7* 30.7*  MCV 87.1 86.5 86.6 85.0  PLT 307 254 226 115*   PT/INR: @labrcntip (inr:5) Cardiac Enzymes: No results found for this basename: CKTOTAL:5,CKMB:5,CKMBINDEX:5,TROPONINI:5 in the last 168 hours CBG: No results found for this basename: GLUCAP:5 in the last 168 hours  Iron Studies: No results found for this basename:  IRON:30,TIBC:30,TRANSFERRIN:30,FERRITIN:30 in the last 168 hours  Physical Exam:  Blood pressure 123/60, pulse 103, temperature 98.5 F (36.9 C), temperature source Oral, resp. rate 18, height 5\' 4"  (1.626 m), weight 52.1 kg (114 lb 13.8 oz), SpO2 98.00%.  Gen- alert, pleasant, no distress Chest- clear bilat Cor- reg, no rub Abd- softNT Ext- no edema Neuro- alert, nonfocal motor exam  Assessment/Plan: 1. Hyponatremia- not responding well to lasix. Will stop lasix and try vaptan Rx for possibility of SIADH.  2. S/P fall with bilat pelvic fx 3. Malnutrition 4. ABLA- s/p transfusion 5. Dementia- stable   Sharon Moselle  MD [facility] [phone] pgr    [phone] cell 11/24/2011, 2:47 PM

## 2011-11-24 NOTE — Progress Notes (Signed)
Physical Therapy Treatment Patient Details Name: Sharon Harrison MRN: [id number] DOB: 1914-07-09 Today's Date: 11/24/2011 Time: 7829-5621 PT Time Calculation (min): 29 min  PT Assessment / Plan / Recommendation Comments on Treatment Session  Pt progressing slowly limited by pain and low level mobility prior to admission.    Follow Up Recommendations   (skilled nursing)     Does the patient have the potential to tolerate intense rehabilitation     Barriers to Discharge        Equipment Recommendations       Recommendations for Other Services    Frequency Min 3X/week   Plan Discharge plan remains appropriate    Precautions / Restrictions Precautions Precautions: Fall Precaution Comments: pelvic fx Restrictions Weight Bearing Restrictions: No RLE Weight Bearing: Weight bearing as tolerated LLE Weight Bearing: Weight bearing as tolerated    Pertinent Vitals/Pain C/o B hip pain    Mobility  Bed Mobility Bed Mobility: Supine to Sit;Sitting - Scoot to [place] of Bed Rolling Right: 1: +2 Total assist Rolling Right: Patient Percentage: 10% Supine to Sit: 1: +2 Total assist Supine to Sit: Patient Percentage: 10% Sitting - Scoot to Edge of Bed: 1: +2 Total assist Sitting - Scoot to Edge of Bed: Patient Percentage: 10% Details for Bed Mobility Assistance: increased time and segmental mvts 2nd MAX c/o hB hip pain and low back pain.  Transfers Transfers: Editor, commissioning Transfers: 1: +2 Total assist Stand Pivot Transfers: Patient Percentage: 10% Details for Transfer Assistance: No AD pivot 1/4 turn from bed to [facility] then to recliner.Pt unable to stand erect 2nd poor kyphotic posture and unable to push self up using B UE's.  Ambulation/Gait Ambulation/Gait Assistance Details: Pt non amb at this time    PT Goals                                                           progressing    Visit Information  Last PT Received On: 11/24/11             End of  Session  left pt in recliner with call light and positioned with pillows   Felecia Shelling  PTA [facility]  Acute  Rehab Pager     [phone]

## 2011-11-24 NOTE — Progress Notes (Signed)
Occupational Therapy Treatment Patient Details Name: Sharon Harrison MRN: [id number] DOB: 1914/08/06 Today's Date: 11/24/2011 Time: 7846-9629 OT Time Calculation (min): 31 min  OT Assessment / Plan / Recommendation Comments on Treatment Session Pt continues to be limited by pain, requiring +2 A for all mobility and ADLs.    Follow Up Recommendations  Skilled nursing facility    Barriers to Discharge       Equipment Recommendations  None recommended by OT    Recommendations for Other Services    Frequency Min 1X/week   Plan Discharge plan remains appropriate    Precautions / Restrictions Precautions Precautions: Fall Precaution Comments: pelvic fx Restrictions Weight Bearing Restrictions: No RLE Weight Bearing: Weight bearing as tolerated LLE Weight Bearing: Weight bearing as tolerated   Pertinent Vitals/Pain Pt cried out during mobility but did not rate pain. Repositioned for comfort.    ADL  Grooming: Performed;Brushing hair;Set up Where Assessed - Grooming: Unsupported sitting Toilet Transfer: Performed;+2 Total assistance Toilet Transfer: Patient Percentage: 0% Toilet Transfer Method: Ambulance person: Materials engineer and Hygiene: Performed;+2 Total assistance Toileting - Architect and Hygiene: Patient Percentage: 0% Where Assessed - Toileting Clothing Manipulation and Hygiene: Sit on 3-in-1 or toilet;Standing ADL Comments: Pt tolerated sitting EOB ~ 8 minutes. Pt continues to demo posterior lean. Pt pivoted to [facility]. Required total A for back peri care in sitting and in standing. Pt incontinent of stool.    OT Diagnosis:    OT Problem List:   OT Treatment Interventions:     OT Goals Miscellaneous OT Goals OT Goal: Miscellaneous Goal #1 - Progress: Progressing toward goals OT Goal: Miscellaneous Goal #2 - Progress: Progressing toward goals  Visit Information  Last OT Received On:  11/24/11 Assistance Needed: +2 PT/OT Co-Evaluation/Treatment: Yes    Subjective Data  Subjective: Ooh, oh, oh! It just hurts everywhere!   Prior Functioning       Cognition  Overall Cognitive Status: No family/caregiver present to determine baseline cognitive functioning Arousal/Alertness: Awake/alert Orientation Level: Disoriented to;Time Behavior During Session: Anxious    Mobility  Shoulder Instructions Bed Mobility Bed Mobility: Supine to Sit;Sitting - Scoot to [place] of Bed Rolling Right: 1: +2 Total assist Rolling Right: Patient Percentage: 10% Supine to Sit: 1: +2 Total assist Supine to Sit: Patient Percentage: 10% Sitting - Scoot to Edge of Bed: 1: +2 Total assist Sitting - Scoot to Edge of Bed: Patient Percentage: 10% Details for Bed Mobility Assistance: increased time and segmental mvts 2nd MAX c/o hB hip pain and low back pain. Transfers Details for Transfer Assistance: No AD pivot 1/4 turn from bed to [facility] then to recliner.Pt unable to stand erect 2nd poor kyphotic posture and unable to push self up using B UE's.       Exercises      Balance Static Sitting Balance Static Sitting - Balance Support: Bilateral upper extremity supported;Feet supported Static Sitting - Level of Assistance: 4: Min assist   End of Session OT - End of Session Activity Tolerance: Patient limited by pain Patient left: in chair;with call bell/phone within reach  GO     Sharon Harrison A OTR/L [phone] 11/24/2011, 3:33 PM

## 2011-11-24 NOTE — Progress Notes (Signed)
Subjective: PT states she feels much better today.  Interval history: Pt continues to be in sinus rhythm. Her sodium is made no improvements overnight. I have spoken with nephrology about considering Tolvapten. Objective: Filed Vitals:   11/23/11 0502 11/23/11 1438 11/23/11 2027 11/24/11 0453  BP: 137/73 126/72 152/80 149/73  Pulse: 78 109 114 93  Temp: 97.7 F (36.5 C) 98.9 F (37.2 C) 98.6 F (37 C) 98.4 F (36.9 C)  TempSrc: Oral Oral Oral Oral  Resp: 20 20 18 18   Height:      Weight: 51.8 kg (114 lb 3.2 oz)   52.1 kg (114 lb 13.8 oz)  SpO2: 96% 98% 98% 99%   Weight change: 0.3 kg (10.6 oz)  Intake/Output Summary (Last 24 hours) at 11/24/11 1342 Last data filed at 11/24/11 0457  Gross per 24 hour  Intake    341 ml  Output    975 ml  Net   -634 ml    General: Alert, awake, oriented x3, in no acute distress. . Today pt is much more energetic and states that she feels well. She is preparing to work with physical therapy and occupational therapy. HEENT: /AT PEERL, EOMI Neck: Trachea midline,  no masses, no thyromegal,y no JVD, no carotid bruit OROPHARYNX:  Moist, No exudate/ erythema/lesions.  Heart: Regular rate and rhythm, without murmurs, rubs, gallops, PMI non-displaced, no heaves or thrills on palpation.  Lungs: Clear to auscultation, no wheezing or rhonchi noted. No increased vocal fremitus resonant to percussion  Musculoskeletal: Decreased edema bilaterally in lower extremity   Lab Results:  [facility] 11/24/11 0445 11/23/11 0516  NA 124* 124*  K 3.6 3.4*  CL 91* 91*  CO2 28 25  GLUCOSE 106* 91  BUN 22 19  CREATININE 0.54 0.50  CALCIUM 8.9 8.7  MG -- 1.7  PHOS 2.9 3.0    Basename 11/24/11 0445 11/23/11 0516  AST -- --  ALT -- --  ALKPHOS -- --  BILITOT -- --  PROT -- --  ALBUMIN 2.4* 2.2*   No results found for this basename: LIPASE:2,AMYLASE:2 in the last 72 hours  Basename 11/24/11 0446 11/23/11 0516  WBC 8.5 8.6  NEUTROABS -- --  HGB 10.9*  10.6*  HCT 31.1* 30.1*  MCV 87.1 86.5  PLT 307 254   No results found for this basename: CKTOTAL:3,CKMB:3,CKMBINDEX:3,TROPONINI:3 in the last 72 hours No components found with this basename: POCBNP:3 No results found for this basename: DDIMER:2 in the last 72 hours No results found for this basename: HGBA1C:2 in the last 72 hours No results found for this basename: CHOL:2,HDL:2,LDLCALC:2,TRIG:2,CHOLHDL:2,LDLDIRECT:2 in the last 72 hours No results found for this basename: TSH,T4TOTAL,FREET3,T3FREE,THYROIDAB in the last 72 hours No results found for this basename: VITAMINB12:2,FOLATE:2,FERRITIN:2,TIBC:2,IRON:2,RETICCTPCT:2 in the last 72 hours  Micro Results: Recent Results (from the past 240 hour(s))  MRSA PCR SCREENING     Status: Normal   Collection Time   11/16/11  9:29 PM      Component Value Range Status Comment   MRSA by PCR NEGATIVE  NEGATIVE Final     Studies/Results: Ct Head Wo Contrast  11/16/2011  *RADIOLOGY REPORT*  Clinical Data:  78 year old female with headache and neck pain following fall.  CT HEAD WITHOUT CONTRAST CT CERVICAL SPINE WITHOUT CONTRAST  Technique:  Multidetector CT imaging of the head and cervical spine was performed following the standard protocol without intravenous contrast.  Multiplanar CT image reconstructions of the cervical spine were also generated.  Comparison:  05/22/2005.  CT HEAD  Findings: Atrophy and chronic small vessel white matter ischemic changes are again identified.  No acute intracranial abnormalities are identified, including mass lesion or mass effect, hydrocephalus, extra-axial fluid collection, midline shift, hemorrhage, or acute infarction.  The visualized bony calvarium is unremarkable.  IMPRESSION: No evidence of acute intracranial abnormality.  Atrophy and chronic small vessel white matter ischemic changes.  CT CERVICAL SPINE  Findings: 1.5 mm retrolisthesis of C5 in relation to C4-C6 is identified, almost certainly degenerative.  There is no evidence of acute fracture or prevertebral soft tissue swelling. Diffuse osteopenia and moderate facet arthropathy throughout the cervical spine noted. Severe degenerative disc disease at C4-C5 an C5-C6 noted. No focal bony lesions are present. No soft tissue abnormalities are noted.  IMPRESSION: No static evidence of acute injury to the cervical spine.  Mild retrolisthesis of C5 with severe degenerative changes at C4-C5 and C5-C6.  Diffuse osteopenia.   Original Report Authenticated By: Rosendo Gros, M.D.    Ct Cervical Spine Wo Contrast  11/16/2011  *RADIOLOGY REPORT*  Clinical Data:  78 year old female with headache and neck pain following fall.  CT HEAD WITHOUT CONTRAST CT CERVICAL SPINE WITHOUT CONTRAST  Technique:  Multidetector CT imaging of the head and cervical spine was performed following the standard protocol without intravenous contrast.  Multiplanar CT image reconstructions of the cervical spine were also generated.  Comparison:  05/22/2005.  CT HEAD  Findings: Atrophy and chronic small vessel white matter ischemic changes are again identified.  No acute intracranial abnormalities are identified, including mass lesion or mass effect, hydrocephalus, extra-axial fluid collection, midline shift, hemorrhage, or acute infarction.  The visualized bony calvarium is unremarkable.  IMPRESSION: No evidence of acute intracranial abnormality.  Atrophy and chronic small vessel white matter ischemic changes.  CT CERVICAL SPINE  Findings: 1.5 mm retrolisthesis of C5 in relation to C4-C6 is identified, almost certainly degenerative. There is no evidence of acute fracture or prevertebral soft tissue swelling. Diffuse osteopenia and moderate facet arthropathy throughout the cervical spine noted. Severe degenerative disc disease at C4-C5 an C5-C6 noted. No focal bony lesions are present. No soft tissue abnormalities are noted.  IMPRESSION: No static evidence of acute injury to the cervical spine.  Mild  retrolisthesis of C5 with severe degenerative changes at C4-C5 and C5-C6.  Diffuse osteopenia.   Original Report Authenticated By: Rosendo Gros, M.D.    Ct Abdomen Pelvis W Contrast  11/16/2011  *RADIOLOGY REPORT*  Clinical Data: Pain post fall.  Possible pelvic fracture.  CT ABDOMEN AND PELVIS WITH CONTRAST  Technique:  Multidetector CT imaging of the abdomen and pelvis was performed following the standard protocol during bolus administration of intravenous contrast.  Contrast: OMNIPAQUE IOHEXOL 300 MG/ML  SOLN  Comparison: None.  Findings: Linear scarring or subsegmental atelectasis posteriorly in the visualized lung bases.  Calcified granuloma in the inferior lingula.  Sub centimeter subcapsular probable cyst in the medial left hepatic segment.  Otherwise unremarkable liver, gallbladder, spleen, adrenal glands, right kidney.  There is a small probable cyst in the upper pole left kidney.  No hydronephrosis.  Stomach and small bowel are nondistended.  Moderate fecal material in the colon. No definite ascites. No free air.  Minimally-displaced fractures of bilateral superior and inferior ischiopubic rami, comminuted and displaced on the left.  There is a moderately large prevesical, extraperitoneal, left retroperitoneal/presacral and anterior body wall hematoma.  There are at least three focal areas of probable active extravasation noted, two just cephalad to the left superior ischiopubic  ramus, the third  posterior to the right superior pubic ramus.  There is a minimally-displaced fracture through the left sacral ala without definite involvement of the sacral foramina.  Fixation hardware across the left femoral neck is partially seen. Progressive vertebral compression fracture deformities of T11, T12, L1, L2, and L4 since previous films of 03/11/2008.  There is mild retropulsion into the spinal canal at T11, T12, L1, and L2.  No displaced posterior element fractures evident.  IMPRESSION:  1.  Acute  fractures of superior and inferior ischiopubic rami, with a moderately large pelvic hematoma and evidence of bilateral active extravasation. I telephoned the critical test results to Dr. Fredderick Phenix at the time of interpretation. 2.  Minimally-displaced fracture of the left sacral ala. 3.  Multiple thoracolumbar compression fracture deformities, progressive since 03/11/2008.   Original Report Authenticated By: Osa Craver, M.D.    Ir Angiogram Pelvis Selective Or Supraselective  11/18/2011  *RADIOLOGY REPORT*  Clinical Data: 78 year old female with multiple pelvic fractures, pelvic hematoma, evidence of active bilateral extravasation in the anterior pelvis on CT.  She is currently hemodynamically stable.  PELVIC SELECTIVE ARTERIOGRAPHY,IR ULTRASOUND GUIDANCE VASC ACCESS RIGHT,TRANSCATHETER THERAPY EMBOLIZATION,ARTERIOGRAPHY,ADDITIONAL ARTERIOGRAPHY  Comparison: None.  Approach:  Right common femoral artery  Vessels catheterized:  Anterior branch left internal iliac artery (third order), anterior branch right internal iliac artery (second order)  Technique and findings: The procedure, risks (including but not limited to bleeding, infection, organ damage), benefits, and alternatives were explained to the patient and daughter.  Questions regarding the procedure were encouraged and answered.  The patient understands and consents to the procedure.The right femoral region was prepped and draped usual sterile fashion. Maximal barrier sterile technique was utilized including caps, mask, sterile gowns, sterile gloves, sterile drape, hand hygiene and skin antiseptic.  Intravenous Fentanyl and Versed were administered as conscious sedation during continuous cardiorespiratory monitoring by the radiology RN, with a total moderate sedation time of 120 minutes.  Under real time ultrasound guidance, the right common femoral artery was accessed with a 21-gauge micropuncture needle.  This was exchanged over a 018 guide wire  for a transitional dilator, which allowed placement of a Benson wire into the abdominal aorta.  Over this, a 6-French vascular sheath was placed.  Through this, a 5- [place] C2 catheter was advanced in attempts to traverse the aortic bifurcation but because of the relatively acute angulation tortuosity, this was unsuccessful.  Aortography at the level of the bifurcation demonstrates mild tortuosity of the distal abdominal aorta   and common iliac arteries without significant atheromatous irregularity, dissection, or stenosis.  The C2 was exchanged for a Sos catheter, used to place a guide wire across the aortic bifurcation.  Over this, the C2 catheter was advanced.  After selective left internal iliac arteriography in multiple projections, an anterior branch of the internal iliac system supplying the region of active extravasation seen on CT was identified and selectively catheterized with the C2 catheter.  Gelfoam slurry embolization was performed.  Follow-up left internal iliac arteriography demonstrates  cessation of flow into the treated segment, no evident complication.  No active extravasation.  In similar fashion, the C2 catheter was directed into   the right internal iliac artery. Selective right pelvic arteriography in multiple projections was obtained.  An anterior branch supplying the region of active extravasation seen on CT was identified. The branch was selectively catheterized and embolization  performed with a Gelfoam slurry.  Follow-up   right internal iliac arteriography demonstrates cessation of flow  in the treated segment.  No evidence of active extravasation or other apparent complication.  The catheter was then removed.  After confirmatory right femoral arteriography, the sheath was removed and hemostasis achieved with the Exoseal device. The patient tolerated the procedure well and remained hemodynamically stable.  No immediate complication.  IMPRESSION: 1.  Technically successful Gelfoam  embolization of the anterior division left internal iliac artery. 2.  Technically successful Gelfoam embolization of the anterior vision right internal iliac artery.   Original Report Authenticated By: Osa Craver, M.D.    Ir Angiogram Pelvis Selective Or Supraselective  11/18/2011  *RADIOLOGY REPORT*  Clinical Data: 78 year old female with multiple pelvic fractures, pelvic hematoma, evidence of active bilateral extravasation in the anterior pelvis on CT.  She is currently hemodynamically stable.  PELVIC SELECTIVE ARTERIOGRAPHY,IR ULTRASOUND GUIDANCE VASC ACCESS RIGHT,TRANSCATHETER THERAPY EMBOLIZATION,ARTERIOGRAPHY,ADDITIONAL ARTERIOGRAPHY  Comparison: None.  Approach:  Right common femoral artery  Vessels catheterized:  Anterior branch left internal iliac artery (third order), anterior branch right internal iliac artery (second order)  Technique and findings: The procedure, risks (including but not limited to bleeding, infection, organ damage), benefits, and alternatives were explained to the patient and daughter.  Questions regarding the procedure were encouraged and answered.  The patient understands and consents to the procedure.The right femoral region was prepped and draped usual sterile fashion. Maximal barrier sterile technique was utilized including caps, mask, sterile gowns, sterile gloves, sterile drape, hand hygiene and skin antiseptic.  Intravenous Fentanyl and Versed were administered as conscious sedation during continuous cardiorespiratory monitoring by the radiology RN, with a total moderate sedation time of 120 minutes.  Under real time ultrasound guidance, the right common femoral artery was accessed with a 21-gauge micropuncture needle.  This was exchanged over a 018 guide wire for a transitional dilator, which allowed placement of a Benson wire into the abdominal aorta.  Over this, a 6-French vascular sheath was placed.  Through this, a 5- [place] C2 catheter was advanced in  attempts to traverse the aortic bifurcation but because of the relatively acute angulation tortuosity, this was unsuccessful.  Aortography at the level of the bifurcation demonstrates mild tortuosity of the distal abdominal aorta   and common iliac arteries without significant atheromatous irregularity, dissection, or stenosis.  The C2 was exchanged for a Sos catheter, used to place a guide wire across the aortic bifurcation.  Over this, the C2 catheter was advanced.  After selective left internal iliac arteriography in multiple projections, an anterior branch of the internal iliac system supplying the region of active extravasation seen on CT was identified and selectively catheterized with the C2 catheter.  Gelfoam slurry embolization was performed.  Follow-up left internal iliac arteriography demonstrates  cessation of flow into the treated segment, no evident complication.  No active extravasation.  In similar fashion, the C2 catheter was directed into   the right internal iliac artery. Selective right pelvic arteriography in multiple projections was obtained.  An anterior branch supplying the region of active extravasation seen on CT was identified. The branch was selectively catheterized and embolization  performed with a Gelfoam slurry.  Follow-up   right internal iliac arteriography demonstrates cessation of flow in the treated segment.  No evidence of active extravasation or other apparent complication.  The catheter was then removed.  After confirmatory right femoral arteriography, the sheath was removed and hemostasis achieved with the Exoseal device. The patient tolerated the procedure well and remained hemodynamically stable.  No immediate complication.  IMPRESSION: 1.  Technically  successful Gelfoam embolization of the anterior division left internal iliac artery. 2.  Technically successful Gelfoam embolization of the anterior vision right internal iliac artery.   Original Report Authenticated By: Osa Craver, M.D.    Ir Angiogram Selective Each Additional Vessel  11/18/2011  *RADIOLOGY REPORT*  Clinical Data: 78 year old female with multiple pelvic fractures, pelvic hematoma, evidence of active bilateral extravasation in the anterior pelvis on CT.  She is currently hemodynamically stable.  PELVIC SELECTIVE ARTERIOGRAPHY,IR ULTRASOUND GUIDANCE VASC ACCESS RIGHT,TRANSCATHETER THERAPY EMBOLIZATION,ARTERIOGRAPHY,ADDITIONAL ARTERIOGRAPHY  Comparison: None.  Approach:  Right common femoral artery  Vessels catheterized:  Anterior branch left internal iliac artery (third order), anterior branch right internal iliac artery (second order)  Technique and findings: The procedure, risks (including but not limited to bleeding, infection, organ damage), benefits, and alternatives were explained to the patient and daughter.  Questions regarding the procedure were encouraged and answered.  The patient understands and consents to the procedure.The right femoral region was prepped and draped usual sterile fashion. Maximal barrier sterile technique was utilized including caps, mask, sterile gowns, sterile gloves, sterile drape, hand hygiene and skin antiseptic.  Intravenous Fentanyl and Versed were administered as conscious sedation during continuous cardiorespiratory monitoring by the radiology RN, with a total moderate sedation time of 120 minutes.  Under real time ultrasound guidance, the right common femoral artery was accessed with a 21-gauge micropuncture needle.  This was exchanged over a 018 guide wire for a transitional dilator, which allowed placement of a Benson wire into the abdominal aorta.  Over this, a 6-French vascular sheath was placed.  Through this, a 5- [place] C2 catheter was advanced in attempts to traverse the aortic bifurcation but because of the relatively acute angulation tortuosity, this was unsuccessful.  Aortography at the level of the bifurcation demonstrates mild tortuosity of the distal  abdominal aorta   and common iliac arteries without significant atheromatous irregularity, dissection, or stenosis.  The C2 was exchanged for a Sos catheter, used to place a guide wire across the aortic bifurcation.  Over this, the C2 catheter was advanced.  After selective left internal iliac arteriography in multiple projections, an anterior branch of the internal iliac system supplying the region of active extravasation seen on CT was identified and selectively catheterized with the C2 catheter.  Gelfoam slurry embolization was performed.  Follow-up left internal iliac arteriography demonstrates  cessation of flow into the treated segment, no evident complication.  No active extravasation.  In similar fashion, the C2 catheter was directed into   the right internal iliac artery. Selective right pelvic arteriography in multiple projections was obtained.  An anterior branch supplying the region of active extravasation seen on CT was identified. The branch was selectively catheterized and embolization  performed with a Gelfoam slurry.  Follow-up   right internal iliac arteriography demonstrates cessation of flow in the treated segment.  No evidence of active extravasation or other apparent complication.  The catheter was then removed.  After confirmatory right femoral arteriography, the sheath was removed and hemostasis achieved with the Exoseal device. The patient tolerated the procedure well and remained hemodynamically stable.  No immediate complication.  IMPRESSION: 1.  Technically successful Gelfoam embolization of the anterior division left internal iliac artery. 2.  Technically successful Gelfoam embolization of the anterior vision right internal iliac artery.   Original Report Authenticated By: Osa Craver, M.D.    Ir Angiogram Selective Each Additional Vessel  11/18/2011  *RADIOLOGY REPORT*  Clinical Data: 78 year old female with  multiple pelvic fractures, pelvic hematoma, evidence of active  bilateral extravasation in the anterior pelvis on CT.  She is currently hemodynamically stable.  PELVIC SELECTIVE ARTERIOGRAPHY,IR ULTRASOUND GUIDANCE VASC ACCESS RIGHT,TRANSCATHETER THERAPY EMBOLIZATION,ARTERIOGRAPHY,ADDITIONAL ARTERIOGRAPHY  Comparison: None.  Approach:  Right common femoral artery  Vessels catheterized:  Anterior branch left internal iliac artery (third order), anterior branch right internal iliac artery (second order)  Technique and findings: The procedure, risks (including but not limited to bleeding, infection, organ damage), benefits, and alternatives were explained to the patient and daughter.  Questions regarding the procedure were encouraged and answered.  The patient understands and consents to the procedure.The right femoral region was prepped and draped usual sterile fashion. Maximal barrier sterile technique was utilized including caps, mask, sterile gowns, sterile gloves, sterile drape, hand hygiene and skin antiseptic.  Intravenous Fentanyl and Versed were administered as conscious sedation during continuous cardiorespiratory monitoring by the radiology RN, with a total moderate sedation time of 120 minutes.  Under real time ultrasound guidance, the right common femoral artery was accessed with a 21-gauge micropuncture needle.  This was exchanged over a 018 guide wire for a transitional dilator, which allowed placement of a Benson wire into the abdominal aorta.  Over this, a 6-French vascular sheath was placed.  Through this, a 5- [place] C2 catheter was advanced in attempts to traverse the aortic bifurcation but because of the relatively acute angulation tortuosity, this was unsuccessful.  Aortography at the level of the bifurcation demonstrates mild tortuosity of the distal abdominal aorta   and common iliac arteries without significant atheromatous irregularity, dissection, or stenosis.  The C2 was exchanged for a Sos catheter, used to place a guide wire across the aortic  bifurcation.  Over this, the C2 catheter was advanced.  After selective left internal iliac arteriography in multiple projections, an anterior branch of the internal iliac system supplying the region of active extravasation seen on CT was identified and selectively catheterized with the C2 catheter.  Gelfoam slurry embolization was performed.  Follow-up left internal iliac arteriography demonstrates  cessation of flow into the treated segment, no evident complication.  No active extravasation.  In similar fashion, the C2 catheter was directed into   the right internal iliac artery. Selective right pelvic arteriography in multiple projections was obtained.  An anterior branch supplying the region of active extravasation seen on CT was identified. The branch was selectively catheterized and embolization  performed with a Gelfoam slurry.  Follow-up   right internal iliac arteriography demonstrates cessation of flow in the treated segment.  No evidence of active extravasation or other apparent complication.  The catheter was then removed.  After confirmatory right femoral arteriography, the sheath was removed and hemostasis achieved with the Exoseal device. The patient tolerated the procedure well and remained hemodynamically stable.  No immediate complication.  IMPRESSION: 1.  Technically successful Gelfoam embolization of the anterior division left internal iliac artery. 2.  Technically successful Gelfoam embolization of the anterior vision right internal iliac artery.   Original Report Authenticated By: Osa Craver, M.D.    Ir Transcath/emboliz  11/18/2011  *RADIOLOGY REPORT*  Clinical Data: 78 year old female with multiple pelvic fractures, pelvic hematoma, evidence of active bilateral extravasation in the anterior pelvis on CT.  She is currently hemodynamically stable.  PELVIC SELECTIVE ARTERIOGRAPHY,IR ULTRASOUND GUIDANCE VASC ACCESS RIGHT,TRANSCATHETER THERAPY EMBOLIZATION,ARTERIOGRAPHY,ADDITIONAL  ARTERIOGRAPHY  Comparison: None.  Approach:  Right common femoral artery  Vessels catheterized:  Anterior branch left internal iliac artery (third order), anterior branch right  internal iliac artery (second order)  Technique and findings: The procedure, risks (including but not limited to bleeding, infection, organ damage), benefits, and alternatives were explained to the patient and daughter.  Questions regarding the procedure were encouraged and answered.  The patient understands and consents to the procedure.The right femoral region was prepped and draped usual sterile fashion. Maximal barrier sterile technique was utilized including caps, mask, sterile gowns, sterile gloves, sterile drape, hand hygiene and skin antiseptic.  Intravenous Fentanyl and Versed were administered as conscious sedation during continuous cardiorespiratory monitoring by the radiology RN, with a total moderate sedation time of 120 minutes.  Under real time ultrasound guidance, the right common femoral artery was accessed with a 21-gauge micropuncture needle.  This was exchanged over a 018 guide wire for a transitional dilator, which allowed placement of a Benson wire into the abdominal aorta.  Over this, a 6-French vascular sheath was placed.  Through this, a 5- [place] C2 catheter was advanced in attempts to traverse the aortic bifurcation but because of the relatively acute angulation tortuosity, this was unsuccessful.  Aortography at the level of the bifurcation demonstrates mild tortuosity of the distal abdominal aorta   and common iliac arteries without significant atheromatous irregularity, dissection, or stenosis.  The C2 was exchanged for a Sos catheter, used to place a guide wire across the aortic bifurcation.  Over this, the C2 catheter was advanced.  After selective left internal iliac arteriography in multiple projections, an anterior branch of the internal iliac system supplying the region of active extravasation seen on CT was  identified and selectively catheterized with the C2 catheter.  Gelfoam slurry embolization was performed.  Follow-up left internal iliac arteriography demonstrates  cessation of flow into the treated segment, no evident complication.  No active extravasation.  In similar fashion, the C2 catheter was directed into   the right internal iliac artery. Selective right pelvic arteriography in multiple projections was obtained.  An anterior branch supplying the region of active extravasation seen on CT was identified. The branch was selectively catheterized and embolization  performed with a Gelfoam slurry.  Follow-up   right internal iliac arteriography demonstrates cessation of flow in the treated segment.  No evidence of active extravasation or other apparent complication.  The catheter was then removed.  After confirmatory right femoral arteriography, the sheath was removed and hemostasis achieved with the Exoseal device. The patient tolerated the procedure well and remained hemodynamically stable.  No immediate complication.  IMPRESSION: 1.  Technically successful Gelfoam embolization of the anterior division left internal iliac artery. 2.  Technically successful Gelfoam embolization of the anterior vision right internal iliac artery.   Original Report Authenticated By: Osa Craver, M.D.    Ir Transcath/emboliz  11/18/2011  *RADIOLOGY REPORT*  Clinical Data: 78 year old female with multiple pelvic fractures, pelvic hematoma, evidence of active bilateral extravasation in the anterior pelvis on CT.  She is currently hemodynamically stable.  PELVIC SELECTIVE ARTERIOGRAPHY,IR ULTRASOUND GUIDANCE VASC ACCESS RIGHT,TRANSCATHETER THERAPY EMBOLIZATION,ARTERIOGRAPHY,ADDITIONAL ARTERIOGRAPHY  Comparison: None.  Approach:  Right common femoral artery  Vessels catheterized:  Anterior branch left internal iliac artery (third order), anterior branch right internal iliac artery (second order)  Technique and findings:  The procedure, risks (including but not limited to bleeding, infection, organ damage), benefits, and alternatives were explained to the patient and daughter.  Questions regarding the procedure were encouraged and answered.  The patient understands and consents to the procedure.The right femoral region was prepped and draped usual sterile fashion. Maximal  barrier sterile technique was utilized including caps, mask, sterile gowns, sterile gloves, sterile drape, hand hygiene and skin antiseptic.  Intravenous Fentanyl and Versed were administered as conscious sedation during continuous cardiorespiratory monitoring by the radiology RN, with a total moderate sedation time of 120 minutes.  Under real time ultrasound guidance, the right common femoral artery was accessed with a 21-gauge micropuncture needle.  This was exchanged over a 018 guide wire for a transitional dilator, which allowed placement of a Benson wire into the abdominal aorta.  Over this, a 6-French vascular sheath was placed.  Through this, a 5- [place] C2 catheter was advanced in attempts to traverse the aortic bifurcation but because of the relatively acute angulation tortuosity, this was unsuccessful.  Aortography at the level of the bifurcation demonstrates mild tortuosity of the distal abdominal aorta   and common iliac arteries without significant atheromatous irregularity, dissection, or stenosis.  The C2 was exchanged for a Sos catheter, used to place a guide wire across the aortic bifurcation.  Over this, the C2 catheter was advanced.  After selective left internal iliac arteriography in multiple projections, an anterior branch of the internal iliac system supplying the region of active extravasation seen on CT was identified and selectively catheterized with the C2 catheter.  Gelfoam slurry embolization was performed.  Follow-up left internal iliac arteriography demonstrates  cessation of flow into the treated segment, no evident complication.  No  active extravasation.  In similar fashion, the C2 catheter was directed into   the right internal iliac artery. Selective right pelvic arteriography in multiple projections was obtained.  An anterior branch supplying the region of active extravasation seen on CT was identified. The branch was selectively catheterized and embolization  performed with a Gelfoam slurry.  Follow-up   right internal iliac arteriography demonstrates cessation of flow in the treated segment.  No evidence of active extravasation or other apparent complication.  The catheter was then removed.  After confirmatory right femoral arteriography, the sheath was removed and hemostasis achieved with the Exoseal device. The patient tolerated the procedure well and remained hemodynamically stable.  No immediate complication.  IMPRESSION: 1.  Technically successful Gelfoam embolization of the anterior division left internal iliac artery. 2.  Technically successful Gelfoam embolization of the anterior vision right internal iliac artery.   Original Report Authenticated By: Osa Craver, M.D.    Ir Angiogram Follow Up Study  11/18/2011  *RADIOLOGY REPORT*  Clinical Data: 78 year old female with multiple pelvic fractures, pelvic hematoma, evidence of active bilateral extravasation in the anterior pelvis on CT.  She is currently hemodynamically stable.  PELVIC SELECTIVE ARTERIOGRAPHY,IR ULTRASOUND GUIDANCE VASC ACCESS RIGHT,TRANSCATHETER THERAPY EMBOLIZATION,ARTERIOGRAPHY,ADDITIONAL ARTERIOGRAPHY  Comparison: None.  Approach:  Right common femoral artery  Vessels catheterized:  Anterior branch left internal iliac artery (third order), anterior branch right internal iliac artery (second order)  Technique and findings: The procedure, risks (including but not limited to bleeding, infection, organ damage), benefits, and alternatives were explained to the patient and daughter.  Questions regarding the procedure were encouraged and answered.  The  patient understands and consents to the procedure.The right femoral region was prepped and draped usual sterile fashion. Maximal barrier sterile technique was utilized including caps, mask, sterile gowns, sterile gloves, sterile drape, hand hygiene and skin antiseptic.  Intravenous Fentanyl and Versed were administered as conscious sedation during continuous cardiorespiratory monitoring by the radiology RN, with a total moderate sedation time of 120 minutes.  Under real time ultrasound guidance, the right common femoral artery  was accessed with a 21-gauge micropuncture needle.  This was exchanged over a 018 guide wire for a transitional dilator, which allowed placement of a Benson wire into the abdominal aorta.  Over this, a 6-French vascular sheath was placed.  Through this, a 5- [place] C2 catheter was advanced in attempts to traverse the aortic bifurcation but because of the relatively acute angulation tortuosity, this was unsuccessful.  Aortography at the level of the bifurcation demonstrates mild tortuosity of the distal abdominal aorta   and common iliac arteries without significant atheromatous irregularity, dissection, or stenosis.  The C2 was exchanged for a Sos catheter, used to place a guide wire across the aortic bifurcation.  Over this, the C2 catheter was advanced.  After selective left internal iliac arteriography in multiple projections, an anterior branch of the internal iliac system supplying the region of active extravasation seen on CT was identified and selectively catheterized with the C2 catheter.  Gelfoam slurry embolization was performed.  Follow-up left internal iliac arteriography demonstrates  cessation of flow into the treated segment, no evident complication.  No active extravasation.  In similar fashion, the C2 catheter was directed into   the right internal iliac artery. Selective right pelvic arteriography in multiple projections was obtained.  An anterior branch supplying the region  of active extravasation seen on CT was identified. The branch was selectively catheterized and embolization  performed with a Gelfoam slurry.  Follow-up   right internal iliac arteriography demonstrates cessation of flow in the treated segment.  No evidence of active extravasation or other apparent complication.  The catheter was then removed.  After confirmatory right femoral arteriography, the sheath was removed and hemostasis achieved with the Exoseal device. The patient tolerated the procedure well and remained hemodynamically stable.  No immediate complication.  IMPRESSION: 1.  Technically successful Gelfoam embolization of the anterior division left internal iliac artery. 2.  Technically successful Gelfoam embolization of the anterior vision right internal iliac artery.   Original Report Authenticated By: Osa Craver, M.D.    Ir Angiogram Follow Up Study  11/16/2011  Dayne Oley Balm III, MD     11/16/2011  6:12 PM Bilateral internal iliac artery anterior branch  gelfoam  embolization No complication No blood loss. See complete dictation in [facility]    Ir US Guide Vasc Access Right  11/18/2011  *RADIOLOGY REPORT*  Clinical Data: 78 year old female with multiple pelvic fractures, pelvic hematoma, evidence of active bilateral extravasation in the anterior pelvis on CT.  She is currently hemodynamically stable.  PELVIC SELECTIVE ARTERIOGRAPHY,IR ULTRASOUND GUIDANCE VASC ACCESS RIGHT,TRANSCATHETER THERAPY EMBOLIZATION,ARTERIOGRAPHY,ADDITIONAL ARTERIOGRAPHY  Comparison: None.  Approach:  Right common femoral artery  Vessels catheterized:  Anterior branch left internal iliac artery (third order), anterior branch right internal iliac artery (second order)  Technique and findings: The procedure, risks (including but not limited to bleeding, infection, organ damage), benefits, and alternatives were explained to the patient and daughter.  Questions regarding the procedure were encouraged and  answered.  The patient understands and consents to the procedure.The right femoral region was prepped and draped usual sterile fashion. Maximal barrier sterile technique was utilized including caps, mask, sterile gowns, sterile gloves, sterile drape, hand hygiene and skin antiseptic.  Intravenous Fentanyl and Versed were administered as conscious sedation during continuous cardiorespiratory monitoring by the radiology RN, with a total moderate sedation time of 120 minutes.  Under real time ultrasound guidance, the right common femoral artery was accessed with a 21-gauge micropuncture needle.  This was exchanged  over a 018 guide wire for a transitional dilator, which allowed placement of a Benson wire into the abdominal aorta.  Over this, a 6-French vascular sheath was placed.  Through this, a 5- [place] C2 catheter was advanced in attempts to traverse the aortic bifurcation but because of the relatively acute angulation tortuosity, this was unsuccessful.  Aortography at the level of the bifurcation demonstrates mild tortuosity of the distal abdominal aorta   and common iliac arteries without significant atheromatous irregularity, dissection, or stenosis.  The C2 was exchanged for a Sos catheter, used to place a guide wire across the aortic bifurcation.  Over this, the C2 catheter was advanced.  After selective left internal iliac arteriography in multiple projections, an anterior branch of the internal iliac system supplying the region of active extravasation seen on CT was identified and selectively catheterized with the C2 catheter.  Gelfoam slurry embolization was performed.  Follow-up left internal iliac arteriography demonstrates  cessation of flow into the treated segment, no evident complication.  No active extravasation.  In similar fashion, the C2 catheter was directed into   the right internal iliac artery. Selective right pelvic arteriography in multiple projections was obtained.  An anterior branch  supplying the region of active extravasation seen on CT was identified. The branch was selectively catheterized and embolization  performed with a Gelfoam slurry.  Follow-up   right internal iliac arteriography demonstrates cessation of flow in the treated segment.  No evidence of active extravasation or other apparent complication.  The catheter was then removed.  After confirmatory right femoral arteriography, the sheath was removed and hemostasis achieved with the Exoseal device. The patient tolerated the procedure well and remained hemodynamically stable.  No immediate complication.  IMPRESSION: 1.  Technically successful Gelfoam embolization of the anterior division left internal iliac artery. 2.  Technically successful Gelfoam embolization of the anterior vision right internal iliac artery.   Original Report Authenticated By: Osa Craver, M.D.    Dg Abd Acute W/chest  11/16/2011  *RADIOLOGY REPORT*  Clinical Data: Abdominal distention, abdominal pain  ACUTE ABDOMEN SERIES (ABDOMEN 2 VIEW & CHEST 1 VIEW)  Comparison: Abdominal film 03/11/2008  Findings: Mild enlarged cardiac silhouette with ectatic aorta. There is no free air beneath hemidiaphragms.  Lungs are hyperinflated but clear.  The left lateral decubitus view demonstrates no intraperitoneal free air.  No dilated dilated loops of large or small bowel.  There is gas the rectum.  There is a left hip internal fixation.  There is irregularity along the superior pubic rami on the left.  IMPRESSION:  1.  No acute cardiopulmonary findings. 2.  No evidence of bowel obstruction or intraperitoneal free air. 3.  Concern for left pubic rami fracture.  Recommend dedicated views of the pelvis or CT for further evaluation.   Original Report Authenticated By: Genevive Bi, M.D.     Medications: I have reviewed the patient's current medications. Scheduled Meds:    . calcium carbonate  1 tablet Oral Daily  . cholecalciferol  1,000 Units Oral Daily   . docusate sodium  100 mg Oral BID  . feeding supplement  1 Container Oral BID BM  . furosemide  20 mg Oral BID  . potassium chloride  40 mEq Oral BID  . protein supplement  6 g Oral TID WC   Continuous Infusions:    . sodium chloride 0.9 % 1,000 mL with potassium chloride 40 mEq infusion Stopped (11/23/11 0506)   PRN Meds:.acetaminophen, acetaminophen, albuterol, HYDROcodone-acetaminophen, morphine  injection, ondansetron (ZOFRAN) IV, ondansetron, polyethylene glycol Assessment/Plan: Patient Active Hospital Problem List: Hyponatremia  Patient has very low protein stores. Her normal sodium of arrival to the hospital likely represents a hemoconcentrated state is. I suspect this patient has had some chronic hyponatremia. Her low protein stores at this point may make it very difficult for her to have effective diuresis for sodium concentration in the serum. However her sodium levels unchanged since yesterday. We'll likely start patient on Tolvapten.  Pelvic hematoma, female (11/16/2011) PT is s/p gelfoam embolization  And s/p  Transfusion of 2 Units  PRBC's.  Hb is stable at present. Continue to monitor.  Bilateral pubic rami fractures (11/16/2011) Appreciate Ortho input. PT/OT. OOB as tolerated.   Anemia due to blood loss, acute (11/16/2011) S/PTransfusion of 2 Units  PRBC's.  Hb is stable at present. Continue to monitor.  Atrial Fibrillation Pt has converted back to sinus rhythm.  Hypokalemia:  Replace potassium as necessary.   Leukocytosis  Resolved  HTN (hypertension) BP adequately but not optimally controlled.  Protein Calorie Malnutrition Pt appears chronically malnourished and has had reported poor intake. Appreciate input from nutrition.      LOS: 8 days

## 2011-11-25 LAB — SODIUM: Sodium: 125 mEq/L — ABNORMAL LOW (ref 135–145)

## 2011-11-25 LAB — UIFE/LIGHT CHAINS/TP QN, 24-HR UR
Alpha 1, Urine: DETECTED — AB
Free Kappa Lt Chains,Ur: 1.54 mg/dL (ref 0.14–2.42)
Free Kappa/Lambda Ratio: 9.06 ratio (ref 2.04–10.37)
Free Lambda Lt Chains,Ur: 0.17 mg/dL (ref 0.02–0.67)
Gamma Globulin, Urine: DETECTED — AB
Total Protein, Urine: 4.7 mg/dL

## 2011-11-25 LAB — RENAL FUNCTION PANEL
CO2: 30 mEq/L (ref 19–32)
Calcium: 9.1 mg/dL (ref 8.4–10.5)
Chloride: 92 mEq/L — ABNORMAL LOW (ref 96–112)
GFR calc Af Amer: 86 mL/min — ABNORMAL LOW (ref >90)
GFR calc non Af Amer: 74 mL/min — ABNORMAL LOW (ref >90)
Potassium: 4 mEq/L (ref 3.5–5.1)
Sodium: 127 mEq/L — ABNORMAL LOW (ref 135–145)

## 2011-11-25 LAB — IMMUNOFIXATION ELECTROPHORESIS
IgA: 41 mg/dL — ABNORMAL LOW (ref 69–380)
IgG (Immunoglobin G), Serum: 645 mg/dL — ABNORMAL LOW (ref 690–1700)
IgM, Serum: 19 mg/dL — ABNORMAL LOW (ref 52–322)

## 2011-11-25 LAB — PROTEIN ELECTROPHORESIS, SERUM
Albumin ELP: 56.2 % (ref 55.8–66.1)
Beta Globulin: 7.2 % (ref 4.7–7.2)
M-Spike, %: 0.41 g/dL
Total Protein ELP: 4.7 g/dL — ABNORMAL LOW (ref 6.0–8.3)

## 2011-11-25 LAB — CBC
Hemoglobin: 10.9 g/dL — ABNORMAL LOW (ref 12.0–15.0)
MCHC: 34.2 g/dL (ref 30.0–36.0)
WBC: 8.7 10*3/uL (ref 4.0–10.5)

## 2011-11-25 MED ORDER — AMLODIPINE BESYLATE 5 MG PO TABS
5.0000 mg | ORAL_TABLET | Freq: Every day | ORAL | Status: DC
  Filled 2011-11-25: qty 1

## 2011-11-25 MED ORDER — DEMECLOCYCLINE HCL 150 MG PO TABS
150.0000 mg | ORAL_TABLET | Freq: Two times a day (BID) | ORAL | Status: DC
  Administered 2011-11-25 – 2011-11-27 (×5): 150 mg via ORAL
  Filled 2011-11-25 (×6): qty 1

## 2011-11-25 MED ORDER — LISINOPRIL 10 MG PO TABS
10.0000 mg | ORAL_TABLET | Freq: Every day | ORAL | Status: DC
  Administered 2011-11-25 – 2011-11-27 (×3): 10 mg via ORAL
  Filled 2011-11-25 (×3): qty 1

## 2011-11-25 NOTE — Progress Notes (Signed)
Subjective: Alert, no complaints  Objective Vital signs in last 24 hours: Filed Vitals:   11/24/11 2100 11/25/11 0535 11/25/11 0927 11/25/11 1409  BP: 127/75 139/73 119/73 112/62  Pulse: 91 84 110 99  Temp: 99 F (37.2 C) 98.4 F (36.9 C) 98.4 F (36.9 C) 98.2 F (36.8 C)  TempSrc: Oral Oral Oral Oral  Resp: 18 18 16 18   Height:      Weight:  49.3 kg (108 lb 11 oz)    SpO2: 98% 97% 94% 97%   Weight change: -2.8 kg (-6 lb 2.8 oz)  Intake/Output Summary (Last 24 hours) at 11/25/11 1516 Last data filed at 11/25/11 1300  Gross per 24 hour  Intake    240 ml  Output   1200 ml  Net   -960 ml   Labs: Basic Metabolic Panel:  Lab 11/25/11 4098 11/25/11 0257 11/24/11 2045 11/24/11 1527 11/24/11 0445 11/23/11 0516 11/22/11 0501 11/20/11 1604 11/20/11 0440 11/19/11 1450  NA 127* 127* 124* 127* 124* 124* 122* -- -- --  K -- 4.0 -- -- 3.6 3.4* 3.4* 4.4 3.7 3.5  CL -- 92* -- -- 91* 91* 92* 93* 94* 90*  CO2 -- 30 -- -- 28 25 24 22 23 24   GLUCOSE -- 111* -- -- 106* 91 100* 124* 105* 155*  BUN -- 23 -- -- 22 19 12 10 11 12   CREATININE -- 0.63 -- -- 0.54 0.50 0.42* 0.39* 0.40* 0.44*  ALB -- -- -- -- -- -- -- -- -- --  CALCIUM -- 9.1 -- -- 8.9 8.7 8.4 8.1* 8.3* 8.4  PHOS -- 3.3 -- -- 2.9 3.0 2.3 -- 1.3* --   Liver Function Tests:  Lab 11/25/11 0257 11/24/11 0445 11/23/11 0516  AST -- -- --  ALT -- -- --  ALKPHOS -- -- --  BILITOT -- -- --  PROT -- -- --  ALBUMIN 2.4* 2.4* 2.2*   No results found for this basename: LIPASE:3,AMYLASE:3 in the last 168 hours No results found for this basename: AMMONIA:3 in the last 168 hours CBC:  Lab 11/25/11 0257 11/24/11 0446 11/23/11 0516 11/22/11 0501  WBC 8.7 8.5 8.6 10.5  NEUTROABS -- -- -- --  HGB 10.9* 10.9* 10.6* 10.4*  HCT 31.9* 31.1* 30.1* 29.7*  MCV 88.9 87.1 86.5 86.6  PLT 324 307 254 226   PT/INR: @labrcntip (inr:5) Cardiac Enzymes: No results found for this basename: CKTOTAL:5,CKMB:5,CKMBINDEX:5,TROPONINI:5 in the last 168  hours CBG: No results found for this basename: GLUCAP:5 in the last 168 hours  Iron Studies: No results found for this basename: IRON:30,TIBC:30,TRANSFERRIN:30,FERRITIN:30 in the last 168 hours  Physical Exam:  Blood pressure 112/62, pulse 99, temperature 98.2 F (36.8 C), temperature source Oral, resp. rate 18, height 5\' 4"  (1.626 m), weight 49.3 kg (108 lb 11 oz), SpO2 97.00%.  Gen- alert, pleasant, no distress Chest- clear bilat Cor- reg, no rub Abd- softNT Ext- no edema Neuro- alert, nonfocal motor exam  Assessment/Plan: 1. Hyponatremia- suspect SIADH and/or low solute (tea and toast) problem. Better after tolvaptan. D/C tolvaptan, will start demeclocycline 150 bid (can increase to max 300 bid). Demeclocycline causes a nephrogenic diabetes insipidus an antibiotic side effect. Would continue this unless Na levels go too high (over 140). Will also order 1000 cc fluid restriction. Salt tabs could be considered as well if needed to keep Na levels over 125 (2-3 gm tid po).  No other suggestions, will sign off. Please call as needed.  2. S/P fall with bilat pelvic  fx 3. Malnutrition 4. ABLA- s/p transfusion 5. Dementia- stable   Vinson Moselle  MD [facility] [phone] pgr    [phone] cell 11/25/2011, 3:16 PM

## 2011-11-25 NOTE — Progress Notes (Signed)
Subjective: PT states she feels much better today. She looks much for energetic.  Interval history: The sodium is increased to 127 this morning Objective:  11/24/11 2100 11/25/11 0535 11/25/11 0927  BP: 127/75 139/73 119/73  Pulse: 91 84 110  Temp: 99 F (37.2 C) 98.4 F (36.9 C) 98.4 F (36.9 C)  TempSrc: Oral Oral Oral  Resp: 18 18 16   Height:     Weight:  49.3 kg (108 lb 11 oz)   SpO2: 98% 97% 94%   Weight change: -2.8 kg (-6 lb 2.8 oz)  Intake/Output Summary (Last 24 hours) at 11/25/11 1851 Last data filed at 11/25/11 1700  Gross per 24 hour  Intake    360 ml  Output   1300 ml  Net   -940 ml    General: Alert, awake, oriented x3, in no acute distress.  Today pt is much more energetic and states that she feels well. She is preparing to work with physical therapy and occupational therapy. HEENT: [address]/AT [place], EOMI Neck: Trachea midline,  no masses, no thyromegal,y no JVD, no carotid bruit OROPHARYNX:  Moist, No exudate/ erythema/lesions.  Heart: Regular rate and rhythm, without murmurs, rubs, gallops, PMI non-displaced, no heaves or thrills on palpation.  Lungs: Clear to auscultation, no wheezing or rhonchi noted. No increased vocal fremitus resonant to percussion  Musculoskeletal: Decreased edema bilaterally in lower extremity   Lab Results:  Basename 11/25/11 1440 11/25/11 0815 11/25/11 0257 11/24/11 0445 11/23/11 0516  NA 126* 127* -- -- --  K -- -- 4.0 3.6 --  CL -- -- 92* 91* --  CO2 -- -- 30 28 --  GLUCOSE -- -- 111* 106* --  BUN -- -- 23 22 --  CREATININE -- -- 0.63 0.54 --  CALCIUM -- -- 9.1 8.9 --  MG -- -- -- -- 1.7  PHOS -- -- 3.3 2.9 --    Basename 11/25/11 0257 11/24/11 0445  AST -- --  ALT -- --  ALKPHOS -- --  BILITOT -- --  PROT -- --  ALBUMIN 2.4* 2.4*   No results found for this basename: LIPASE:2,AMYLASE:2 in the last 72 hours  Basename 11/25/11 0257 11/24/11 0446  WBC 8.7 8.5  NEUTROABS -- --  HGB 10.9* 10.9*  HCT 31.9* 31.1*    MCV 88.9 87.1  PLT 324 307   No results found for this basename: CKTOTAL:3,CKMB:3,CKMBINDEX:3,TROPONINI:3 in the last 72 hours No components found with this basename: POCBNP:3 No results found for this basename: DDIMER:2 in the last 72 hours No results found for this basename: HGBA1C:2 in the last 72 hours No results found for this basename: CHOL:2,HDL:2,LDLCALC:2,TRIG:2,CHOLHDL:2,LDLDIRECT:2 in the last 72 hours No results found for this basename: TSH,T4TOTAL,FREET3,T3FREE,THYROIDAB in the last 72 hours No results found for this basename: VITAMINB12:2,FOLATE:2,FERRITIN:2,TIBC:2,IRON:2,RETICCTPCT:2 in the last 72 hours  Micro Results: Recent Results (from the past 240 hour(s))  MRSA PCR SCREENING     Status: Normal   Collection Time   11/16/11  9:29 PM      Component Value Range Status Comment   MRSA by PCR NEGATIVE  NEGATIVE Final     Studies/Results: Ct Head Wo Contrast  11/16/2011  *RADIOLOGY REPORT*  Clinical Data:  78 year old female with headache and neck pain following fall.  CT HEAD WITHOUT CONTRAST CT CERVICAL SPINE WITHOUT CONTRAST  Technique:  Multidetector CT imaging of the head and cervical spine was performed following the standard protocol without intravenous contrast.  Multiplanar CT image reconstructions of the cervical spine were also  generated.  Comparison:  05/22/2005.  CT HEAD  Findings: Atrophy and chronic small vessel white matter ischemic changes are again identified.  No acute intracranial abnormalities are identified, including mass lesion or mass effect, hydrocephalus, extra-axial fluid collection, midline shift, hemorrhage, or acute infarction.  The visualized bony calvarium is unremarkable.  IMPRESSION: No evidence of acute intracranial abnormality.  Atrophy and chronic small vessel white matter ischemic changes.  CT CERVICAL SPINE  Findings: 1.5 mm retrolisthesis of C5 in relation to C4-C6 is identified, almost certainly degenerative. There is no evidence of  acute fracture or prevertebral soft tissue swelling. Diffuse osteopenia and moderate facet arthropathy throughout the cervical spine noted. Severe degenerative disc disease at C4-C5 an C5-C6 noted. No focal bony lesions are present. No soft tissue abnormalities are noted.  IMPRESSION: No static evidence of acute injury to the cervical spine.  Mild retrolisthesis of C5 with severe degenerative changes at C4-C5 and C5-C6.  Diffuse osteopenia.   Original Report Authenticated By: Rosendo Gros, M.D.    Ct Cervical Spine Wo Contrast  11/16/2011  *RADIOLOGY REPORT*  Clinical Data:  78 year old female with headache and neck pain following fall.  CT HEAD WITHOUT CONTRAST CT CERVICAL SPINE WITHOUT CONTRAST  Technique:  Multidetector CT imaging of the head and cervical spine was performed following the standard protocol without intravenous contrast.  Multiplanar CT image reconstructions of the cervical spine were also generated.  Comparison:  05/22/2005.  CT HEAD  Findings: Atrophy and chronic small vessel white matter ischemic changes are again identified.  No acute intracranial abnormalities are identified, including mass lesion or mass effect, hydrocephalus, extra-axial fluid collection, midline shift, hemorrhage, or acute infarction.  The visualized bony calvarium is unremarkable.  IMPRESSION: No evidence of acute intracranial abnormality.  Atrophy and chronic small vessel white matter ischemic changes.  CT CERVICAL SPINE  Findings: 1.5 mm retrolisthesis of C5 in relation to C4-C6 is identified, almost certainly degenerative. There is no evidence of acute fracture or prevertebral soft tissue swelling. Diffuse osteopenia and moderate facet arthropathy throughout the cervical spine noted. Severe degenerative disc disease at C4-C5 an C5-C6 noted. No focal bony lesions are present. No soft tissue abnormalities are noted.  IMPRESSION: No static evidence of acute injury to the cervical spine.  Mild retrolisthesis of C5 with  severe degenerative changes at C4-C5 and C5-C6.  Diffuse osteopenia.   Original Report Authenticated By: Rosendo Gros, M.D.    Ct Abdomen Pelvis W Contrast  11/16/2011  *RADIOLOGY REPORT*  Clinical Data: Pain post fall.  Possible pelvic fracture.  CT ABDOMEN AND PELVIS WITH CONTRAST  Technique:  Multidetector CT imaging of the abdomen and pelvis was performed following the standard protocol during bolus administration of intravenous contrast.  Contrast: OMNIPAQUE IOHEXOL 300 MG/ML  SOLN  Comparison: None.  Findings: Linear scarring or subsegmental atelectasis posteriorly in the visualized lung bases.  Calcified granuloma in the inferior lingula.  Sub centimeter subcapsular probable cyst in the medial left hepatic segment.  Otherwise unremarkable liver, gallbladder, spleen, adrenal glands, right kidney.  There is a small probable cyst in the upper pole left kidney.  No hydronephrosis.  Stomach and small bowel are nondistended.  Moderate fecal material in the colon. No definite ascites. No free air.  Minimally-displaced fractures of bilateral superior and inferior ischiopubic rami, comminuted and displaced on the left.  There is a moderately large prevesical, extraperitoneal, left retroperitoneal/presacral and anterior body wall hematoma.  There are at least three focal areas of probable active extravasation  noted, two just cephalad to the left superior ischiopubic ramus, the third  posterior to the right superior pubic ramus.  There is a minimally-displaced fracture through the left sacral ala without definite involvement of the sacral foramina.  Fixation hardware across the left femoral neck is partially seen. Progressive vertebral compression fracture deformities of T11, T12, L1, L2, and L4 since previous films of 03/11/2008.  There is mild retropulsion into the spinal canal at T11, T12, L1, and L2.  No displaced posterior element fractures evident.  IMPRESSION:  1.  Acute fractures of superior and  inferior ischiopubic rami, with a moderately large pelvic hematoma and evidence of bilateral active extravasation. I telephoned the critical test results to Dr. Fredderick Phenix at the time of interpretation. 2.  Minimally-displaced fracture of the left sacral ala. 3.  Multiple thoracolumbar compression fracture deformities, progressive since 03/11/2008.   Original Report Authenticated By: Osa Craver, M.D.    Ir Angiogram Pelvis Selective Or Supraselective  11/18/2011  *RADIOLOGY REPORT*  Clinical Data: 78 year old female with multiple pelvic fractures, pelvic hematoma, evidence of active bilateral extravasation in the anterior pelvis on CT.  She is currently hemodynamically stable.  PELVIC SELECTIVE ARTERIOGRAPHY,IR ULTRASOUND GUIDANCE VASC ACCESS RIGHT,TRANSCATHETER THERAPY EMBOLIZATION,ARTERIOGRAPHY,ADDITIONAL ARTERIOGRAPHY  Comparison: None.  Approach:  Right common femoral artery  Vessels catheterized:  Anterior branch left internal iliac artery (third order), anterior branch right internal iliac artery (second order)  Technique and findings: The procedure, risks (including but not limited to bleeding, infection, organ damage), benefits, and alternatives were explained to the patient and daughter.  Questions regarding the procedure were encouraged and answered.  The patient understands and consents to the procedure.The right femoral region was prepped and draped usual sterile fashion. Maximal barrier sterile technique was utilized including caps, mask, sterile gowns, sterile gloves, sterile drape, hand hygiene and skin antiseptic.  Intravenous Fentanyl and Versed were administered as conscious sedation during continuous cardiorespiratory monitoring by the radiology RN, with a total moderate sedation time of 120 minutes.  Under real time ultrasound guidance, the right common femoral artery was accessed with a 21-gauge micropuncture needle.  This was exchanged over a 018 guide wire for a transitional dilator,  which allowed placement of a Benson wire into the abdominal aorta.  Over this, a 6-French vascular sheath was placed.  Through this, a 5- [place] C2 catheter was advanced in attempts to traverse the aortic bifurcation but because of the relatively acute angulation tortuosity, this was unsuccessful.  Aortography at the level of the bifurcation demonstrates mild tortuosity of the distal abdominal aorta   and common iliac arteries without significant atheromatous irregularity, dissection, or stenosis.  The C2 was exchanged for a Sos catheter, used to place a guide wire across the aortic bifurcation.  Over this, the C2 catheter was advanced.  After selective left internal iliac arteriography in multiple projections, an anterior branch of the internal iliac system supplying the region of active extravasation seen on CT was identified and selectively catheterized with the C2 catheter.  Gelfoam slurry embolization was performed.  Follow-up left internal iliac arteriography demonstrates  cessation of flow into the treated segment, no evident complication.  No active extravasation.  In similar fashion, the C2 catheter was directed into   the right internal iliac artery. Selective right pelvic arteriography in multiple projections was obtained.  An anterior branch supplying the region of active extravasation seen on CT was identified. The branch was selectively catheterized and embolization  performed with a Gelfoam slurry.  Follow-up  right internal iliac arteriography demonstrates cessation of flow in the treated segment.  No evidence of active extravasation or other apparent complication.  The catheter was then removed.  After confirmatory right femoral arteriography, the sheath was removed and hemostasis achieved with the Exoseal device. The patient tolerated the procedure well and remained hemodynamically stable.  No immediate complication.  IMPRESSION: 1.  Technically successful Gelfoam embolization of the anterior  division left internal iliac artery. 2.  Technically successful Gelfoam embolization of the anterior vision right internal iliac artery.   Original Report Authenticated By: Osa Craver, M.D.    Ir Angiogram Pelvis Selective Or Supraselective  11/18/2011  *RADIOLOGY REPORT*  Clinical Data: 78 year old female with multiple pelvic fractures, pelvic hematoma, evidence of active bilateral extravasation in the anterior pelvis on CT.  She is currently hemodynamically stable.  PELVIC SELECTIVE ARTERIOGRAPHY,IR ULTRASOUND GUIDANCE VASC ACCESS RIGHT,TRANSCATHETER THERAPY EMBOLIZATION,ARTERIOGRAPHY,ADDITIONAL ARTERIOGRAPHY  Comparison: None.  Approach:  Right common femoral artery  Vessels catheterized:  Anterior branch left internal iliac artery (third order), anterior branch right internal iliac artery (second order)  Technique and findings: The procedure, risks (including but not limited to bleeding, infection, organ damage), benefits, and alternatives were explained to the patient and daughter.  Questions regarding the procedure were encouraged and answered.  The patient understands and consents to the procedure.The right femoral region was prepped and draped usual sterile fashion. Maximal barrier sterile technique was utilized including caps, mask, sterile gowns, sterile gloves, sterile drape, hand hygiene and skin antiseptic.  Intravenous Fentanyl and Versed were administered as conscious sedation during continuous cardiorespiratory monitoring by the radiology RN, with a total moderate sedation time of 120 minutes.  Under real time ultrasound guidance, the right common femoral artery was accessed with a 21-gauge micropuncture needle.  This was exchanged over a 018 guide wire for a transitional dilator, which allowed placement of a Benson wire into the abdominal aorta.  Over this, a 6-French vascular sheath was placed.  Through this, a 5- [place] C2 catheter was advanced in attempts to traverse the aortic  bifurcation but because of the relatively acute angulation tortuosity, this was unsuccessful.  Aortography at the level of the bifurcation demonstrates mild tortuosity of the distal abdominal aorta   and common iliac arteries without significant atheromatous irregularity, dissection, or stenosis.  The C2 was exchanged for a Sos catheter, used to place a guide wire across the aortic bifurcation.  Over this, the C2 catheter was advanced.  After selective left internal iliac arteriography in multiple projections, an anterior branch of the internal iliac system supplying the region of active extravasation seen on CT was identified and selectively catheterized with the C2 catheter.  Gelfoam slurry embolization was performed.  Follow-up left internal iliac arteriography demonstrates  cessation of flow into the treated segment, no evident complication.  No active extravasation.  In similar fashion, the C2 catheter was directed into   the right internal iliac artery. Selective right pelvic arteriography in multiple projections was obtained.  An anterior branch supplying the region of active extravasation seen on CT was identified. The branch was selectively catheterized and embolization  performed with a Gelfoam slurry.  Follow-up   right internal iliac arteriography demonstrates cessation of flow in the treated segment.  No evidence of active extravasation or other apparent complication.  The catheter was then removed.  After confirmatory right femoral arteriography, the sheath was removed and hemostasis achieved with the Exoseal device. The patient tolerated the procedure well and remained hemodynamically stable.  No immediate complication.  IMPRESSION: 1.  Technically successful Gelfoam embolization of the anterior division left internal iliac artery. 2.  Technically successful Gelfoam embolization of the anterior vision right internal iliac artery.   Original Report Authenticated By: Osa Craver, M.D.    Ir  Angiogram Selective Each Additional Vessel  11/18/2011  *RADIOLOGY REPORT*  Clinical Data: 78 year old female with multiple pelvic fractures, pelvic hematoma, evidence of active bilateral extravasation in the anterior pelvis on CT.  She is currently hemodynamically stable.  PELVIC SELECTIVE ARTERIOGRAPHY,IR ULTRASOUND GUIDANCE VASC ACCESS RIGHT,TRANSCATHETER THERAPY EMBOLIZATION,ARTERIOGRAPHY,ADDITIONAL ARTERIOGRAPHY  Comparison: None.  Approach:  Right common femoral artery  Vessels catheterized:  Anterior branch left internal iliac artery (third order), anterior branch right internal iliac artery (second order)  Technique and findings: The procedure, risks (including but not limited to bleeding, infection, organ damage), benefits, and alternatives were explained to the patient and daughter.  Questions regarding the procedure were encouraged and answered.  The patient understands and consents to the procedure.The right femoral region was prepped and draped usual sterile fashion. Maximal barrier sterile technique was utilized including caps, mask, sterile gowns, sterile gloves, sterile drape, hand hygiene and skin antiseptic.  Intravenous Fentanyl and Versed were administered as conscious sedation during continuous cardiorespiratory monitoring by the radiology RN, with a total moderate sedation time of 120 minutes.  Under real time ultrasound guidance, the right common femoral artery was accessed with a 21-gauge micropuncture needle.  This was exchanged over a 018 guide wire for a transitional dilator, which allowed placement of a Benson wire into the abdominal aorta.  Over this, a 6-French vascular sheath was placed.  Through this, a 5- [place] C2 catheter was advanced in attempts to traverse the aortic bifurcation but because of the relatively acute angulation tortuosity, this was unsuccessful.  Aortography at the level of the bifurcation demonstrates mild tortuosity of the distal abdominal aorta   and common  iliac arteries without significant atheromatous irregularity, dissection, or stenosis.  The C2 was exchanged for a Sos catheter, used to place a guide wire across the aortic bifurcation.  Over this, the C2 catheter was advanced.  After selective left internal iliac arteriography in multiple projections, an anterior branch of the internal iliac system supplying the region of active extravasation seen on CT was identified and selectively catheterized with the C2 catheter.  Gelfoam slurry embolization was performed.  Follow-up left internal iliac arteriography demonstrates  cessation of flow into the treated segment, no evident complication.  No active extravasation.  In similar fashion, the C2 catheter was directed into   the right internal iliac artery. Selective right pelvic arteriography in multiple projections was obtained.  An anterior branch supplying the region of active extravasation seen on CT was identified. The branch was selectively catheterized and embolization  performed with a Gelfoam slurry.  Follow-up   right internal iliac arteriography demonstrates cessation of flow in the treated segment.  No evidence of active extravasation or other apparent complication.  The catheter was then removed.  After confirmatory right femoral arteriography, the sheath was removed and hemostasis achieved with the Exoseal device. The patient tolerated the procedure well and remained hemodynamically stable.  No immediate complication.  IMPRESSION: 1.  Technically successful Gelfoam embolization of the anterior division left internal iliac artery. 2.  Technically successful Gelfoam embolization of the anterior vision right internal iliac artery.   Original Report Authenticated By: Osa Craver, M.D.    Ir Angiogram Selective Each Additional Vessel  11/18/2011  *  RADIOLOGY REPORT*  Clinical Data: 78 year old female with multiple pelvic fractures, pelvic hematoma, evidence of active bilateral extravasation in the  anterior pelvis on CT.  She is currently hemodynamically stable.  PELVIC SELECTIVE ARTERIOGRAPHY,IR ULTRASOUND GUIDANCE VASC ACCESS RIGHT,TRANSCATHETER THERAPY EMBOLIZATION,ARTERIOGRAPHY,ADDITIONAL ARTERIOGRAPHY  Comparison: None.  Approach:  Right common femoral artery  Vessels catheterized:  Anterior branch left internal iliac artery (third order), anterior branch right internal iliac artery (second order)  Technique and findings: The procedure, risks (including but not limited to bleeding, infection, organ damage), benefits, and alternatives were explained to the patient and daughter.  Questions regarding the procedure were encouraged and answered.  The patient understands and consents to the procedure.The right femoral region was prepped and draped usual sterile fashion. Maximal barrier sterile technique was utilized including caps, mask, sterile gowns, sterile gloves, sterile drape, hand hygiene and skin antiseptic.  Intravenous Fentanyl and Versed were administered as conscious sedation during continuous cardiorespiratory monitoring by the radiology RN, with a total moderate sedation time of 120 minutes.  Under real time ultrasound guidance, the right common femoral artery was accessed with a 21-gauge micropuncture needle.  This was exchanged over a 018 guide wire for a transitional dilator, which allowed placement of a Benson wire into the abdominal aorta.  Over this, a 6-French vascular sheath was placed.  Through this, a 5- [place] C2 catheter was advanced in attempts to traverse the aortic bifurcation but because of the relatively acute angulation tortuosity, this was unsuccessful.  Aortography at the level of the bifurcation demonstrates mild tortuosity of the distal abdominal aorta   and common iliac arteries without significant atheromatous irregularity, dissection, or stenosis.  The C2 was exchanged for a Sos catheter, used to place a guide wire across the aortic bifurcation.  Over this, the C2 catheter  was advanced.  After selective left internal iliac arteriography in multiple projections, an anterior branch of the internal iliac system supplying the region of active extravasation seen on CT was identified and selectively catheterized with the C2 catheter.  Gelfoam slurry embolization was performed.  Follow-up left internal iliac arteriography demonstrates  cessation of flow into the treated segment, no evident complication.  No active extravasation.  In similar fashion, the C2 catheter was directed into   the right internal iliac artery. Selective right pelvic arteriography in multiple projections was obtained.  An anterior branch supplying the region of active extravasation seen on CT was identified. The branch was selectively catheterized and embolization  performed with a Gelfoam slurry.  Follow-up   right internal iliac arteriography demonstrates cessation of flow in the treated segment.  No evidence of active extravasation or other apparent complication.  The catheter was then removed.  After confirmatory right femoral arteriography, the sheath was removed and hemostasis achieved with the Exoseal device. The patient tolerated the procedure well and remained hemodynamically stable.  No immediate complication.  IMPRESSION: 1.  Technically successful Gelfoam embolization of the anterior division left internal iliac artery. 2.  Technically successful Gelfoam embolization of the anterior vision right internal iliac artery.   Original Report Authenticated By: Osa Craver, M.D.    Ir Transcath/emboliz  11/18/2011  *RADIOLOGY REPORT*  Clinical Data: 78 year old female with multiple pelvic fractures, pelvic hematoma, evidence of active bilateral extravasation in the anterior pelvis on CT.  She is currently hemodynamically stable.  PELVIC SELECTIVE ARTERIOGRAPHY,IR ULTRASOUND GUIDANCE VASC ACCESS RIGHT,TRANSCATHETER THERAPY EMBOLIZATION,ARTERIOGRAPHY,ADDITIONAL ARTERIOGRAPHY  Comparison: None.   Approach:  Right common femoral artery  Vessels catheterized:  Anterior branch left  internal iliac artery (third order), anterior branch right internal iliac artery (second order)  Technique and findings: The procedure, risks (including but not limited to bleeding, infection, organ damage), benefits, and alternatives were explained to the patient and daughter.  Questions regarding the procedure were encouraged and answered.  The patient understands and consents to the procedure.The right femoral region was prepped and draped usual sterile fashion. Maximal barrier sterile technique was utilized including caps, mask, sterile gowns, sterile gloves, sterile drape, hand hygiene and skin antiseptic.  Intravenous Fentanyl and Versed were administered as conscious sedation during continuous cardiorespiratory monitoring by the radiology RN, with a total moderate sedation time of 120 minutes.  Under real time ultrasound guidance, the right common femoral artery was accessed with a 21-gauge micropuncture needle.  This was exchanged over a 018 guide wire for a transitional dilator, which allowed placement of a Benson wire into the abdominal aorta.  Over this, a 6-French vascular sheath was placed.  Through this, a 5- [place] C2 catheter was advanced in attempts to traverse the aortic bifurcation but because of the relatively acute angulation tortuosity, this was unsuccessful.  Aortography at the level of the bifurcation demonstrates mild tortuosity of the distal abdominal aorta   and common iliac arteries without significant atheromatous irregularity, dissection, or stenosis.  The C2 was exchanged for a Sos catheter, used to place a guide wire across the aortic bifurcation.  Over this, the C2 catheter was advanced.  After selective left internal iliac arteriography in multiple projections, an anterior branch of the internal iliac system supplying the region of active extravasation seen on CT was identified and selectively  catheterized with the C2 catheter.  Gelfoam slurry embolization was performed.  Follow-up left internal iliac arteriography demonstrates  cessation of flow into the treated segment, no evident complication.  No active extravasation.  In similar fashion, the C2 catheter was directed into   the right internal iliac artery. Selective right pelvic arteriography in multiple projections was obtained.  An anterior branch supplying the region of active extravasation seen on CT was identified. The branch was selectively catheterized and embolization  performed with a Gelfoam slurry.  Follow-up   right internal iliac arteriography demonstrates cessation of flow in the treated segment.  No evidence of active extravasation or other apparent complication.  The catheter was then removed.  After confirmatory right femoral arteriography, the sheath was removed and hemostasis achieved with the Exoseal device. The patient tolerated the procedure well and remained hemodynamically stable.  No immediate complication.  IMPRESSION: 1.  Technically successful Gelfoam embolization of the anterior division left internal iliac artery. 2.  Technically successful Gelfoam embolization of the anterior vision right internal iliac artery.   Original Report Authenticated By: Osa Craver, M.D.    Ir Transcath/emboliz  11/18/2011  *RADIOLOGY REPORT*  Clinical Data: 78 year old female with multiple pelvic fractures, pelvic hematoma, evidence of active bilateral extravasation in the anterior pelvis on CT.  She is currently hemodynamically stable.  PELVIC SELECTIVE ARTERIOGRAPHY,IR ULTRASOUND GUIDANCE VASC ACCESS RIGHT,TRANSCATHETER THERAPY EMBOLIZATION,ARTERIOGRAPHY,ADDITIONAL ARTERIOGRAPHY  Comparison: None.  Approach:  Right common femoral artery  Vessels catheterized:  Anterior branch left internal iliac artery (third order), anterior branch right internal iliac artery (second order)  Technique and findings: The procedure, risks  (including but not limited to bleeding, infection, organ damage), benefits, and alternatives were explained to the patient and daughter.  Questions regarding the procedure were encouraged and answered.  The patient understands and consents to the procedure.The right femoral region  was prepped and draped usual sterile fashion. Maximal barrier sterile technique was utilized including caps, mask, sterile gowns, sterile gloves, sterile drape, hand hygiene and skin antiseptic.  Intravenous Fentanyl and Versed were administered as conscious sedation during continuous cardiorespiratory monitoring by the radiology RN, with a total moderate sedation time of 120 minutes.  Under real time ultrasound guidance, the right common femoral artery was accessed with a 21-gauge micropuncture needle.  This was exchanged over a 018 guide wire for a transitional dilator, which allowed placement of a Benson wire into the abdominal aorta.  Over this, a 6-French vascular sheath was placed.  Through this, a 5- [place] C2 catheter was advanced in attempts to traverse the aortic bifurcation but because of the relatively acute angulation tortuosity, this was unsuccessful.  Aortography at the level of the bifurcation demonstrates mild tortuosity of the distal abdominal aorta   and common iliac arteries without significant atheromatous irregularity, dissection, or stenosis.  The C2 was exchanged for a Sos catheter, used to place a guide wire across the aortic bifurcation.  Over this, the C2 catheter was advanced.  After selective left internal iliac arteriography in multiple projections, an anterior branch of the internal iliac system supplying the region of active extravasation seen on CT was identified and selectively catheterized with the C2 catheter.  Gelfoam slurry embolization was performed.  Follow-up left internal iliac arteriography demonstrates  cessation of flow into the treated segment, no evident complication.  No active extravasation.   In similar fashion, the C2 catheter was directed into   the right internal iliac artery. Selective right pelvic arteriography in multiple projections was obtained.  An anterior branch supplying the region of active extravasation seen on CT was identified. The branch was selectively catheterized and embolization  performed with a Gelfoam slurry.  Follow-up   right internal iliac arteriography demonstrates cessation of flow in the treated segment.  No evidence of active extravasation or other apparent complication.  The catheter was then removed.  After confirmatory right femoral arteriography, the sheath was removed and hemostasis achieved with the Exoseal device. The patient tolerated the procedure well and remained hemodynamically stable.  No immediate complication.  IMPRESSION: 1.  Technically successful Gelfoam embolization of the anterior division left internal iliac artery. 2.  Technically successful Gelfoam embolization of the anterior vision right internal iliac artery.   Original Report Authenticated By: Osa Craver, M.D.    Ir Angiogram Follow Up Study  11/18/2011  *RADIOLOGY REPORT*  Clinical Data: 79 year old female with multiple pelvic fractures, pelvic hematoma, evidence of active bilateral extravasation in the anterior pelvis on CT.  She is currently hemodynamically stable.  PELVIC SELECTIVE ARTERIOGRAPHY,IR ULTRASOUND GUIDANCE VASC ACCESS RIGHT,TRANSCATHETER THERAPY EMBOLIZATION,ARTERIOGRAPHY,ADDITIONAL ARTERIOGRAPHY  Comparison: None.  Approach:  Right common femoral artery  Vessels catheterized:  Anterior branch left internal iliac artery (third order), anterior branch right internal iliac artery (second order)  Technique and findings: The procedure, risks (including but not limited to bleeding, infection, organ damage), benefits, and alternatives were explained to the patient and daughter.  Questions regarding the procedure were encouraged and answered.  The patient understands and  consents to the procedure.The right femoral region was prepped and draped usual sterile fashion. Maximal barrier sterile technique was utilized including caps, mask, sterile gowns, sterile gloves, sterile drape, hand hygiene and skin antiseptic.  Intravenous Fentanyl and Versed were administered as conscious sedation during continuous cardiorespiratory monitoring by the radiology RN, with a total moderate sedation time of 120 minutes.  Under real  time ultrasound guidance, the right common femoral artery was accessed with a 21-gauge micropuncture needle.  This was exchanged over a 018 guide wire for a transitional dilator, which allowed placement of a Benson wire into the abdominal aorta.  Over this, a 6-French vascular sheath was placed.  Through this, a 5- [place] C2 catheter was advanced in attempts to traverse the aortic bifurcation but because of the relatively acute angulation tortuosity, this was unsuccessful.  Aortography at the level of the bifurcation demonstrates mild tortuosity of the distal abdominal aorta   and common iliac arteries without significant atheromatous irregularity, dissection, or stenosis.  The C2 was exchanged for a Sos catheter, used to place a guide wire across the aortic bifurcation.  Over this, the C2 catheter was advanced.  After selective left internal iliac arteriography in multiple projections, an anterior branch of the internal iliac system supplying the region of active extravasation seen on CT was identified and selectively catheterized with the C2 catheter.  Gelfoam slurry embolization was performed.  Follow-up left internal iliac arteriography demonstrates  cessation of flow into the treated segment, no evident complication.  No active extravasation.  In similar fashion, the C2 catheter was directed into   the right internal iliac artery. Selective right pelvic arteriography in multiple projections was obtained.  An anterior branch supplying the region of active extravasation  seen on CT was identified. The branch was selectively catheterized and embolization  performed with a Gelfoam slurry.  Follow-up   right internal iliac arteriography demonstrates cessation of flow in the treated segment.  No evidence of active extravasation or other apparent complication.  The catheter was then removed.  After confirmatory right femoral arteriography, the sheath was removed and hemostasis achieved with the Exoseal device. The patient tolerated the procedure well and remained hemodynamically stable.  No immediate complication.  IMPRESSION: 1.  Technically successful Gelfoam embolization of the anterior division left internal iliac artery. 2.  Technically successful Gelfoam embolization of the anterior vision right internal iliac artery.   Original Report Authenticated By: Osa Craver, M.D.    Ir Angiogram Follow Up Study  11/16/2011  Dayne Oley Balm III, MD     11/16/2011  6:12 PM Bilateral internal iliac artery anterior branch  gelfoam  embolization No complication No blood loss. See complete dictation in [facility].    Ir US Guide Vasc Access Right  11/18/2011  *RADIOLOGY REPORT*  Clinical Data: 78 year old female with multiple pelvic fractures, pelvic hematoma, evidence of active bilateral extravasation in the anterior pelvis on CT.  She is currently hemodynamically stable.  PELVIC SELECTIVE ARTERIOGRAPHY,IR ULTRASOUND GUIDANCE VASC ACCESS RIGHT,TRANSCATHETER THERAPY EMBOLIZATION,ARTERIOGRAPHY,ADDITIONAL ARTERIOGRAPHY  Comparison: None.  Approach:  Right common femoral artery  Vessels catheterized:  Anterior branch left internal iliac artery (third order), anterior branch right internal iliac artery (second order)  Technique and findings: The procedure, risks (including but not limited to bleeding, infection, organ damage), benefits, and alternatives were explained to the patient and daughter.  Questions regarding the procedure were encouraged and answered.  The patient  understands and consents to the procedure.The right femoral region was prepped and draped usual sterile fashion. Maximal barrier sterile technique was utilized including caps, mask, sterile gowns, sterile gloves, sterile drape, hand hygiene and skin antiseptic.  Intravenous Fentanyl and Versed were administered as conscious sedation during continuous cardiorespiratory monitoring by the radiology RN, with a total moderate sedation time of 120 minutes.  Under real time ultrasound guidance, the right common femoral artery was accessed with  a 21-gauge micropuncture needle.  This was exchanged over a 018 guide wire for a transitional dilator, which allowed placement of a Benson wire into the abdominal aorta.  Over this, a 6-French vascular sheath was placed.  Through this, a 5- [place] C2 catheter was advanced in attempts to traverse the aortic bifurcation but because of the relatively acute angulation tortuosity, this was unsuccessful.  Aortography at the level of the bifurcation demonstrates mild tortuosity of the distal abdominal aorta   and common iliac arteries without significant atheromatous irregularity, dissection, or stenosis.  The C2 was exchanged for a Sos catheter, used to place a guide wire across the aortic bifurcation.  Over this, the C2 catheter was advanced.  After selective left internal iliac arteriography in multiple projections, an anterior branch of the internal iliac system supplying the region of active extravasation seen on CT was identified and selectively catheterized with the C2 catheter.  Gelfoam slurry embolization was performed.  Follow-up left internal iliac arteriography demonstrates  cessation of flow into the treated segment, no evident complication.  No active extravasation.  In similar fashion, the C2 catheter was directed into   the right internal iliac artery. Selective right pelvic arteriography in multiple projections was obtained.  An anterior branch supplying the region of  active extravasation seen on CT was identified. The branch was selectively catheterized and embolization  performed with a Gelfoam slurry.  Follow-up   right internal iliac arteriography demonstrates cessation of flow in the treated segment.  No evidence of active extravasation or other apparent complication.  The catheter was then removed.  After confirmatory right femoral arteriography, the sheath was removed and hemostasis achieved with the Exoseal device. The patient tolerated the procedure well and remained hemodynamically stable.  No immediate complication.  IMPRESSION: 1.  Technically successful Gelfoam embolization of the anterior division left internal iliac artery. 2.  Technically successful Gelfoam embolization of the anterior vision right internal iliac artery.   Original Report Authenticated By: Osa Craver, M.D.    Dg Abd Acute W/chest  11/16/2011  *RADIOLOGY REPORT*  Clinical Data: Abdominal distention, abdominal pain  ACUTE ABDOMEN SERIES (ABDOMEN 2 VIEW & CHEST 1 VIEW)  Comparison: Abdominal film 03/11/2008  Findings: Mild enlarged cardiac silhouette with ectatic aorta. There is no free air beneath hemidiaphragms.  Lungs are hyperinflated but clear.  The left lateral decubitus view demonstrates no intraperitoneal free air.  No dilated dilated loops of large or small bowel.  There is gas the rectum.  There is a left hip internal fixation.  There is irregularity along the superior pubic rami on the left.  IMPRESSION:  1.  No acute cardiopulmonary findings. 2.  No evidence of bowel obstruction or intraperitoneal free air. 3.  Concern for left pubic rami fracture.  Recommend dedicated views of the pelvis or CT for further evaluation.   Original Report Authenticated By: Genevive Bi, M.D.     Medications: I have reviewed the patient's current medications. Scheduled Meds:    . calcium carbonate  1 tablet Oral Daily  . cholecalciferol  1,000 Units Oral Daily  . demeclocycline  150  mg Oral Q12H  . docusate sodium  100 mg Oral BID  . feeding supplement  1 Container Oral BID BM  . lisinopril  10 mg Oral Daily  . potassium chloride  40 mEq Oral BID  . protein supplement  6 g Oral TID WC  . [DISCONTINUED] amLODipine  5 mg Oral Daily  . [DISCONTINUED] lisinopril  10  mg Oral Daily  . [DISCONTINUED] tolvaptan  15 mg Oral Q24H   Continuous Infusions:    . sodium chloride 0.9 % 1,000 mL with potassium chloride 40 mEq infusion Stopped (11/23/11 0506)   PRN Meds:.acetaminophen, acetaminophen, albuterol, HYDROcodone-acetaminophen, morphine injection, ondansetron (ZOFRAN) IV, ondansetron, polyethylene glycol Assessment/Plan: Patient Active Hospital Problem List: Hyponatremia  Patient has very low protein stores. Her normal sodium of arrival to the hospital likely represents a hemoconcentrated state is. I suspect this patient has had some chronic hyponatremia. Her low protein stores at this point may make it very difficult for her to have effective diuresis for sodium concentration in the serum. Since starting Tolovapten yesterday her serum sodium has improved significantly. I suspect that we will be able to discontinue Tolovapten and start her on the demeclocycline for maintenance the sodium is greater than 125.   Pelvic hematoma, female (11/16/2011) PT is s/p gelfoam embolization  And s/p  Transfusion of 2 Units  PRBC's.  Hb is stable at present. Continue to monitor.  Bilateral pubic rami fractures (11/16/2011) Appreciate Ortho input. PT/OT. OOB as tolerated.   Anemia due to blood loss, acute (11/16/2011) S/PTransfusion of 2 Units  PRBC's.  Hb is stable at present. Continue to monitor.  Atrial Fibrillation Pt has converted back to sinus rhythm.  Hypokalemia:  Replace potassium as necessary.   Leukocytosis  Resolved  HTN (hypertension) BP adequately but not optimally controlled.  Protein Calorie Malnutrition Pt appears chronically malnourished and has had  reported poor intake. Appreciate input from nutrition.      LOS: 9 days

## 2011-11-26 LAB — SODIUM
Sodium: 125 mEq/L — ABNORMAL LOW (ref 135–145)
Sodium: 125 mEq/L — ABNORMAL LOW (ref 135–145)

## 2011-11-26 LAB — RENAL FUNCTION PANEL
CO2: 23 mEq/L (ref 19–32)
Calcium: 9 mg/dL (ref 8.4–10.5)
GFR calc Af Amer: >90 mL/min (ref >90)
GFR calc non Af Amer: 78 mL/min — ABNORMAL LOW (ref >90)
Potassium: 4.8 mEq/L (ref 3.5–5.1)
Sodium: 123 mEq/L — ABNORMAL LOW (ref 135–145)

## 2011-11-26 MED ORDER — SODIUM BICARBONATE 650 MG PO TABS
650.0000 mg | ORAL_TABLET | Freq: Two times a day (BID) | ORAL | Status: DC
  Administered 2011-11-26 – 2011-11-27 (×3): 650 mg via ORAL
  Filled 2011-11-26 (×4): qty 1

## 2011-11-26 NOTE — Progress Notes (Signed)
Subjective: No complaints. Feels well. Daughter Raynelle Fanning present and updated on plan of care.  Interval history: The sodium is stable at  125 this morning Objective:  11/24/11 2100 11/25/11 0535 11/25/11 0927  BP: 127/75 139/73 119/73  Pulse: 91 84 110  Temp: 99 F (37.2 C) 98.4 F (36.9 C) 98.4 F (36.9 C)  TempSrc: Oral Oral Oral  Resp: 18 18 16   Height:     Weight:  49.3 kg (108 lb 11 oz)   SpO2: 98% 97% 94%   Weight change: 1.1 kg (2 lb 6.8 oz)  Intake/Output Summary (Last 24 hours) at 11/26/11 1219 Last data filed at 11/26/11 0900  Gross per 24 hour  Intake    600 ml  Output    750 ml  Net   -150 ml    General: Alert, awake, oriented x3, in no acute distress.  Today pt is much more energetic and states that she feels well. She is preparing to work with physical therapy and occupational therapy. HEENT: [place]/AT PEER[place], EOMI Neck: Trachea midline,  no masses, no thyromegal,y no JVD, no carotid bruit OROPHARYNX:  Moist, No exudate/ erythema/lesions.  Heart: Regular rate and rhythm, without murmurs, rubs, gallops, PMI non-displaced, no heaves or thrills on palpation.  Lungs: Clear to auscultation, no wheezing or rhonchi noted. No increased vocal fremitus resonant to percussion  Musculoskeletal: Decreased edema bilaterally in lower extremity   Lab Results:  Basename 11/26/11 0830 11/26/11 0300 11/25/11 0257  NA 125* 123* --  K -- 4.8 4.0  CL -- 92* 92*  CO2 -- 23 30  GLUCOSE -- 105* 111*  BUN -- 22 23  CREATININE -- 0.54 0.63  CALCIUM -- 9.0 9.1  MG -- -- --  PHOS -- 2.8 3.3    Basename 11/26/11 0300 11/25/11 0257  AST -- --  ALT -- --  ALKPHOS -- --  BILITOT -- --  PROT -- --  ALBUMIN 2.4* 2.4*    Basename 11/25/11 0257 11/24/11 0446  WBC 8.7 8.5  NEUTROABS -- --  HGB 10.9* 10.9*  HCT 31.9* 31.1*  MCV 88.9 87.1  PLT 324 307    Micro Results: Recent Results (from the past 240 hour(s))  MRSA PCR SCREENING     Status: Normal   Collection Time   11/16/11   9:29 PM      Component Value Range Status Comment   MRSA by PCR NEGATIVE  NEGATIVE Final     Studies/Results: Ct Head Wo Contrast  11/16/2011  *RADIOLOGY REPORT*  Clinical Data:  78 year old female with headache and neck pain following fall.  CT HEAD WITHOUT CONTRAST CT CERVICAL SPINE WITHOUT CONTRAST  Technique:  Multidetector CT imaging of the head and cervical spine was performed following the standard protocol without intravenous contrast.  Multiplanar CT image reconstructions of the cervical spine were also generated.  Comparison:  05/22/2005.  CT HEAD  Findings: Atrophy and chronic small vessel white matter ischemic changes are again identified.  No acute intracranial abnormalities are identified, including mass lesion or mass effect, hydrocephalus, extra-axial fluid collection, midline shift, hemorrhage, or acute infarction.  The visualized bony calvarium is unremarkable.  IMPRESSION: No evidence of acute intracranial abnormality.  Atrophy and chronic small vessel white matter ischemic changes.  CT CERVICAL SPINE  Findings: 1.5 mm retrolisthesis of C5 in relation to C4-C6 is identified, almost certainly degenerative. There is no evidence of acute fracture or prevertebral soft tissue swelling. Diffuse osteopenia and moderate facet arthropathy throughout the cervical spine noted.  Severe degenerative disc disease at C4-C5 an C5-C6 noted. No focal bony lesions are present. No soft tissue abnormalities are noted.  IMPRESSION: No static evidence of acute injury to the cervical spine.  Mild retrolisthesis of C5 with severe degenerative changes at C4-C5 and C5-C6.  Diffuse osteopenia.   Original Report Authenticated By: Rosendo Gros, M.D.    Ct Cervical Spine Wo Contrast  11/16/2011  *RADIOLOGY REPORT*  Clinical Data:  78 year old female with headache and neck pain following fall.  CT HEAD WITHOUT CONTRAST CT CERVICAL SPINE WITHOUT CONTRAST  Technique:  Multidetector CT imaging of the head and cervical  spine was performed following the standard protocol without intravenous contrast.  Multiplanar CT image reconstructions of the cervical spine were also generated.  Comparison:  05/22/2005.  CT HEAD  Findings: Atrophy and chronic small vessel white matter ischemic changes are again identified.  No acute intracranial abnormalities are identified, including mass lesion or mass effect, hydrocephalus, extra-axial fluid collection, midline shift, hemorrhage, or acute infarction.  The visualized bony calvarium is unremarkable.  IMPRESSION: No evidence of acute intracranial abnormality.  Atrophy and chronic small vessel white matter ischemic changes.  CT CERVICAL SPINE  Findings: 1.5 mm retrolisthesis of C5 in relation to C4-C6 is identified, almost certainly degenerative. There is no evidence of acute fracture or prevertebral soft tissue swelling. Diffuse osteopenia and moderate facet arthropathy throughout the cervical spine noted. Severe degenerative disc disease at C4-C5 an C5-C6 noted. No focal bony lesions are present. No soft tissue abnormalities are noted.  IMPRESSION: No static evidence of acute injury to the cervical spine.  Mild retrolisthesis of C5 with severe degenerative changes at C4-C5 and C5-C6.  Diffuse osteopenia.   Original Report Authenticated By: Rosendo Gros, M.D.    Ct Abdomen Pelvis W Contrast  11/16/2011  *RADIOLOGY REPORT*  Clinical Data: Pain post fall.  Possible pelvic fracture.  CT ABDOMEN AND PELVIS WITH CONTRAST  Technique:  Multidetector CT imaging of the abdomen and pelvis was performed following the standard protocol during bolus administration of intravenous contrast.  Contrast: OMNIPAQUE IOHEXOL 300 MG/ML  SOLN  Comparison: None.  Findings: Linear scarring or subsegmental atelectasis posteriorly in the visualized lung bases.  Calcified granuloma in the inferior lingula.  Sub centimeter subcapsular probable cyst in the medial left hepatic segment.  Otherwise unremarkable liver,  gallbladder, spleen, adrenal glands, right kidney.  There is a small probable cyst in the upper pole left kidney.  No hydronephrosis.  Stomach and small bowel are nondistended.  Moderate fecal material in the colon. No definite ascites. No free air.  Minimally-displaced fractures of bilateral superior and inferior ischiopubic rami, comminuted and displaced on the left.  There is a moderately large prevesical, extraperitoneal, left retroperitoneal/presacral and anterior body wall hematoma.  There are at least three focal areas of probable active extravasation noted, two just cephalad to the left superior ischiopubic ramus, the third  posterior to the right superior pubic ramus.  There is a minimally-displaced fracture through the left sacral ala without definite involvement of the sacral foramina.  Fixation hardware across the left femoral neck is partially seen. Progressive vertebral compression fracture deformities of T11, T12, L1, L2, and L4 since previous films of 03/11/2008.  There is mild retropulsion into the spinal canal at T11, T12, L1, and L2.  No displaced posterior element fractures evident.  IMPRESSION:  1.  Acute fractures of superior and inferior ischiopubic rami, with a moderately large pelvic hematoma and evidence of bilateral active extravasation.  I telephoned the critical test results to Dr. Fredderick Phenix at the time of interpretation. 2.  Minimally-displaced fracture of the left sacral ala. 3.  Multiple thoracolumbar compression fracture deformities, progressive since 03/11/2008.   Original Report Authenticated By: Osa Craver, M.D.    Ir Angiogram Pelvis Selective Or Supraselective  11/18/2011  *RADIOLOGY REPORT*  Clinical Data: 78 year old female with multiple pelvic fractures, pelvic hematoma, evidence of active bilateral extravasation in the anterior pelvis on CT.  She is currently hemodynamically stable.  PELVIC SELECTIVE ARTERIOGRAPHY,IR ULTRASOUND GUIDANCE VASC ACCESS  RIGHT,TRANSCATHETER THERAPY EMBOLIZATION,ARTERIOGRAPHY,ADDITIONAL ARTERIOGRAPHY  Comparison: None.  Approach:  Right common femoral artery  Vessels catheterized:  Anterior branch left internal iliac artery (third order), anterior branch right internal iliac artery (second order)  Technique and findings: The procedure, risks (including but not limited to bleeding, infection, organ damage), benefits, and alternatives were explained to the patient and daughter.  Questions regarding the procedure were encouraged and answered.  The patient understands and consents to the procedure.The right femoral region was prepped and draped usual sterile fashion. Maximal barrier sterile technique was utilized including caps, mask, sterile gowns, sterile gloves, sterile drape, hand hygiene and skin antiseptic.  Intravenous Fentanyl and Versed were administered as conscious sedation during continuous cardiorespiratory monitoring by the radiology RN, with a total moderate sedation time of 120 minutes.  Under real time ultrasound guidance, the right common femoral artery was accessed with a 21-gauge micropuncture needle.  This was exchanged over a 018 guide wire for a transitional dilator, which allowed placement of a Benson wire into the abdominal aorta.  Over this, a 6-French vascular sheath was placed.  Through this, a 5- [place] C2 catheter was advanced in attempts to traverse the aortic bifurcation but because of the relatively acute angulation tortuosity, this was unsuccessful.  Aortography at the level of the bifurcation demonstrates mild tortuosity of the distal abdominal aorta   and common iliac arteries without significant atheromatous irregularity, dissection, or stenosis.  The C2 was exchanged for a Sos catheter, used to place a guide wire across the aortic bifurcation.  Over this, the C2 catheter was advanced.  After selective left internal iliac arteriography in multiple projections, an anterior branch of the internal iliac  system supplying the region of active extravasation seen on CT was identified and selectively catheterized with the C2 catheter.  Gelfoam slurry embolization was performed.  Follow-up left internal iliac arteriography demonstrates  cessation of flow into the treated segment, no evident complication.  No active extravasation.  In similar fashion, the C2 catheter was directed into   the right internal iliac artery. Selective right pelvic arteriography in multiple projections was obtained.  An anterior branch supplying the region of active extravasation seen on CT was identified. The branch was selectively catheterized and embolization  performed with a Gelfoam slurry.  Follow-up   right internal iliac arteriography demonstrates cessation of flow in the treated segment.  No evidence of active extravasation or other apparent complication.  The catheter was then removed.  After confirmatory right femoral arteriography, the sheath was removed and hemostasis achieved with the Exoseal device. The patient tolerated the procedure well and remained hemodynamically stable.  No immediate complication.  IMPRESSION: 1.  Technically successful Gelfoam embolization of the anterior division left internal iliac artery. 2.  Technically successful Gelfoam embolization of the anterior vision right internal iliac artery.   Original Report Authenticated By: Osa Craver, M.D.    Ir Angiogram Pelvis Selective Or Supraselective  11/18/2011  *  RADIOLOGY REPORT*  Clinical Data: 78 year old female with multiple pelvic fractures, pelvic hematoma, evidence of active bilateral extravasation in the anterior pelvis on CT.  She is currently hemodynamically stable.  PELVIC SELECTIVE ARTERIOGRAPHY,IR ULTRASOUND GUIDANCE VASC ACCESS RIGHT,TRANSCATHETER THERAPY EMBOLIZATION,ARTERIOGRAPHY,ADDITIONAL ARTERIOGRAPHY  Comparison: None.  Approach:  Right common femoral artery  Vessels catheterized:  Anterior branch left internal iliac artery (third  order), anterior branch right internal iliac artery (second order)  Technique and findings: The procedure, risks (including but not limited to bleeding, infection, organ damage), benefits, and alternatives were explained to the patient and daughter.  Questions regarding the procedure were encouraged and answered.  The patient understands and consents to the procedure.The right femoral region was prepped and draped usual sterile fashion. Maximal barrier sterile technique was utilized including caps, mask, sterile gowns, sterile gloves, sterile drape, hand hygiene and skin antiseptic.  Intravenous Fentanyl and Versed were administered as conscious sedation during continuous cardiorespiratory monitoring by the radiology RN, with a total moderate sedation time of 120 minutes.  Under real time ultrasound guidance, the right common femoral artery was accessed with a 21-gauge micropuncture needle.  This was exchanged over a 018 guide wire for a transitional dilator, which allowed placement of a Benson wire into the abdominal aorta.  Over this, a 6-French vascular sheath was placed.  Through this, a 5- [place] C2 catheter was advanced in attempts to traverse the aortic bifurcation but because of the relatively acute angulation tortuosity, this was unsuccessful.  Aortography at the level of the bifurcation demonstrates mild tortuosity of the distal abdominal aorta   and common iliac arteries without significant atheromatous irregularity, dissection, or stenosis.  The C2 was exchanged for a Sos catheter, used to place a guide wire across the aortic bifurcation.  Over this, the C2 catheter was advanced.  After selective left internal iliac arteriography in multiple projections, an anterior branch of the internal iliac system supplying the region of active extravasation seen on CT was identified and selectively catheterized with the C2 catheter.  Gelfoam slurry embolization was performed.  Follow-up left internal iliac  arteriography demonstrates  cessation of flow into the treated segment, no evident complication.  No active extravasation.  In similar fashion, the C2 catheter was directed into   the right internal iliac artery. Selective right pelvic arteriography in multiple projections was obtained.  An anterior branch supplying the region of active extravasation seen on CT was identified. The branch was selectively catheterized and embolization  performed with a Gelfoam slurry.  Follow-up   right internal iliac arteriography demonstrates cessation of flow in the treated segment.  No evidence of active extravasation or other apparent complication.  The catheter was then removed.  After confirmatory right femoral arteriography, the sheath was removed and hemostasis achieved with the Exoseal device. The patient tolerated the procedure well and remained hemodynamically stable.  No immediate complication.  IMPRESSION: 1.  Technically successful Gelfoam embolization of the anterior division left internal iliac artery. 2.  Technically successful Gelfoam embolization of the anterior vision right internal iliac artery.   Original Report Authenticated By: Osa Craver, M.D.    Ir Angiogram Selective Each Additional Vessel  11/18/2011  *RADIOLOGY REPORT*  Clinical Data: 78 year old female with multiple pelvic fractures, pelvic hematoma, evidence of active bilateral extravasation in the anterior pelvis on CT.  She is currently hemodynamically stable.  PELVIC SELECTIVE ARTERIOGRAPHY,IR ULTRASOUND GUIDANCE VASC ACCESS RIGHT,TRANSCATHETER THERAPY EMBOLIZATION,ARTERIOGRAPHY,ADDITIONAL ARTERIOGRAPHY  Comparison: None.  Approach:  Right common femoral artery  Vessels catheterized:  Anterior branch left internal iliac artery (third order), anterior branch right internal iliac artery (second order)  Technique and findings: The procedure, risks (including but not limited to bleeding, infection, organ damage), benefits, and alternatives  were explained to the patient and daughter.  Questions regarding the procedure were encouraged and answered.  The patient understands and consents to the procedure.The right femoral region was prepped and draped usual sterile fashion. Maximal barrier sterile technique was utilized including caps, mask, sterile gowns, sterile gloves, sterile drape, hand hygiene and skin antiseptic.  Intravenous Fentanyl and Versed were administered as conscious sedation during continuous cardiorespiratory monitoring by the radiology RN, with a total moderate sedation time of 120 minutes.  Under real time ultrasound guidance, the right common femoral artery was accessed with a 21-gauge micropuncture needle.  This was exchanged over a 018 guide wire for a transitional dilator, which allowed placement of a Benson wire into the abdominal aorta.  Over this, a 6-French vascular sheath was placed.  Through this, a 5- [place] C2 catheter was advanced in attempts to traverse the aortic bifurcation but because of the relatively acute angulation tortuosity, this was unsuccessful.  Aortography at the level of the bifurcation demonstrates mild tortuosity of the distal abdominal aorta   and common iliac arteries without significant atheromatous irregularity, dissection, or stenosis.  The C2 was exchanged for a Sos catheter, used to place a guide wire across the aortic bifurcation.  Over this, the C2 catheter was advanced.  After selective left internal iliac arteriography in multiple projections, an anterior branch of the internal iliac system supplying the region of active extravasation seen on CT was identified and selectively catheterized with the C2 catheter.  Gelfoam slurry embolization was performed.  Follow-up left internal iliac arteriography demonstrates  cessation of flow into the treated segment, no evident complication.  No active extravasation.  In similar fashion, the C2 catheter was directed into   the right internal iliac artery.  Selective right pelvic arteriography in multiple projections was obtained.  An anterior branch supplying the region of active extravasation seen on CT was identified. The branch was selectively catheterized and embolization  performed with a Gelfoam slurry.  Follow-up   right internal iliac arteriography demonstrates cessation of flow in the treated segment.  No evidence of active extravasation or other apparent complication.  The catheter was then removed.  After confirmatory right femoral arteriography, the sheath was removed and hemostasis achieved with the Exoseal device. The patient tolerated the procedure well and remained hemodynamically stable.  No immediate complication.  IMPRESSION: 1.  Technically successful Gelfoam embolization of the anterior division left internal iliac artery. 2.  Technically successful Gelfoam embolization of the anterior vision right internal iliac artery.   Original Report Authenticated By: Osa Craver, M.D.    Ir Angiogram Selective Each Additional Vessel  11/18/2011  *RADIOLOGY REPORT*  Clinical Data: 78 year old female with multiple pelvic fractures, pelvic hematoma, evidence of active bilateral extravasation in the anterior pelvis on CT.  She is currently hemodynamically stable.  PELVIC SELECTIVE ARTERIOGRAPHY,IR ULTRASOUND GUIDANCE VASC ACCESS RIGHT,TRANSCATHETER THERAPY EMBOLIZATION,ARTERIOGRAPHY,ADDITIONAL ARTERIOGRAPHY  Comparison: None.  Approach:  Right common femoral artery  Vessels catheterized:  Anterior branch left internal iliac artery (third order), anterior branch right internal iliac artery (second order)  Technique and findings: The procedure, risks (including but not limited to bleeding, infection, organ damage), benefits, and alternatives were explained to the patient and daughter.  Questions regarding the procedure were encouraged and answered.  The patient understands and  consents to the procedure.The right femoral region was prepped and draped  usual sterile fashion. Maximal barrier sterile technique was utilized including caps, mask, sterile gowns, sterile gloves, sterile drape, hand hygiene and skin antiseptic.  Intravenous Fentanyl and Versed were administered as conscious sedation during continuous cardiorespiratory monitoring by the radiology RN, with a total moderate sedation time of 120 minutes.  Under real time ultrasound guidance, the right common femoral artery was accessed with a 21-gauge micropuncture needle.  This was exchanged over a 018 guide wire for a transitional dilator, which allowed placement of a Benson wire into the abdominal aorta.  Over this, a 6-French vascular sheath was placed.  Through this, a 5- [place] C2 catheter was advanced in attempts to traverse the aortic bifurcation but because of the relatively acute angulation tortuosity, this was unsuccessful.  Aortography at the level of the bifurcation demonstrates mild tortuosity of the distal abdominal aorta   and common iliac arteries without significant atheromatous irregularity, dissection, or stenosis.  The C2 was exchanged for a Sos catheter, used to place a guide wire across the aortic bifurcation.  Over this, the C2 catheter was advanced.  After selective left internal iliac arteriography in multiple projections, an anterior branch of the internal iliac system supplying the region of active extravasation seen on CT was identified and selectively catheterized with the C2 catheter.  Gelfoam slurry embolization was performed.  Follow-up left internal iliac arteriography demonstrates  cessation of flow into the treated segment, no evident complication.  No active extravasation.  In similar fashion, the C2 catheter was directed into   the right internal iliac artery. Selective right pelvic arteriography in multiple projections was obtained.  An anterior branch supplying the region of active extravasation seen on CT was identified. The branch was selectively catheterized and  embolization  performed with a Gelfoam slurry.  Follow-up   right internal iliac arteriography demonstrates cessation of flow in the treated segment.  No evidence of active extravasation or other apparent complication.  The catheter was then removed.  After confirmatory right femoral arteriography, the sheath was removed and hemostasis achieved with the Exoseal device. The patient tolerated the procedure well and remained hemodynamically stable.  No immediate complication.  IMPRESSION: 1.  Technically successful Gelfoam embolization of the anterior division left internal iliac artery. 2.  Technically successful Gelfoam embolization of the anterior vision right internal iliac artery.   Original Report Authenticated By: Osa Craver, M.D.    Ir Transcath/emboliz  11/18/2011  *RADIOLOGY REPORT*  Clinical Data: 78 year old female with multiple pelvic fractures, pelvic hematoma, evidence of active bilateral extravasation in the anterior pelvis on CT.  She is currently hemodynamically stable.  PELVIC SELECTIVE ARTERIOGRAPHY,IR ULTRASOUND GUIDANCE VASC ACCESS RIGHT,TRANSCATHETER THERAPY EMBOLIZATION,ARTERIOGRAPHY,ADDITIONAL ARTERIOGRAPHY  Comparison: None.  Approach:  Right common femoral artery  Vessels catheterized:  Anterior branch left internal iliac artery (third order), anterior branch right internal iliac artery (second order)  Technique and findings: The procedure, risks (including but not limited to bleeding, infection, organ damage), benefits, and alternatives were explained to the patient and daughter.  Questions regarding the procedure were encouraged and answered.  The patient understands and consents to the procedure.The right femoral region was prepped and draped usual sterile fashion. Maximal barrier sterile technique was utilized including caps, mask, sterile gowns, sterile gloves, sterile drape, hand hygiene and skin antiseptic.  Intravenous Fentanyl and Versed were administered as conscious  sedation during continuous cardiorespiratory monitoring by the radiology RN, with a total moderate sedation time of  120 minutes.  Under real time ultrasound guidance, the right common femoral artery was accessed with a 21-gauge micropuncture needle.  This was exchanged over a 018 guide wire for a transitional dilator, which allowed placement of a Benson wire into the abdominal aorta.  Over this, a 6-French vascular sheath was placed.  Through this, a 5- [place] C2 catheter was advanced in attempts to traverse the aortic bifurcation but because of the relatively acute angulation tortuosity, this was unsuccessful.  Aortography at the level of the bifurcation demonstrates mild tortuosity of the distal abdominal aorta   and common iliac arteries without significant atheromatous irregularity, dissection, or stenosis.  The C2 was exchanged for a Sos catheter, used to place a guide wire across the aortic bifurcation.  Over this, the C2 catheter was advanced.  After selective left internal iliac arteriography in multiple projections, an anterior branch of the internal iliac system supplying the region of active extravasation seen on CT was identified and selectively catheterized with the C2 catheter.  Gelfoam slurry embolization was performed.  Follow-up left internal iliac arteriography demonstrates  cessation of flow into the treated segment, no evident complication.  No active extravasation.  In similar fashion, the C2 catheter was directed into   the right internal iliac artery. Selective right pelvic arteriography in multiple projections was obtained.  An anterior branch supplying the region of active extravasation seen on CT was identified. The branch was selectively catheterized and embolization  performed with a Gelfoam slurry.  Follow-up   right internal iliac arteriography demonstrates cessation of flow in the treated segment.  No evidence of active extravasation or other apparent complication.  The catheter was  then removed.  After confirmatory right femoral arteriography, the sheath was removed and hemostasis achieved with the Exoseal device. The patient tolerated the procedure well and remained hemodynamically stable.  No immediate complication.  IMPRESSION: 1.  Technically successful Gelfoam embolization of the anterior division left internal iliac artery. 2.  Technically successful Gelfoam embolization of the anterior vision right internal iliac artery.   Original Report Authenticated By: Osa Craver, M.D.    Ir Transcath/emboliz  11/18/2011  *RADIOLOGY REPORT*  Clinical Data: 78 year old female with multiple pelvic fractures, pelvic hematoma, evidence of active bilateral extravasation in the anterior pelvis on CT.  She is currently hemodynamically stable.  PELVIC SELECTIVE ARTERIOGRAPHY,IR ULTRASOUND GUIDANCE VASC ACCESS RIGHT,TRANSCATHETER THERAPY EMBOLIZATION,ARTERIOGRAPHY,ADDITIONAL ARTERIOGRAPHY  Comparison: None.  Approach:  Right common femoral artery  Vessels catheterized:  Anterior branch left internal iliac artery (third order), anterior branch right internal iliac artery (second order)  Technique and findings: The procedure, risks (including but not limited to bleeding, infection, organ damage), benefits, and alternatives were explained to the patient and daughter.  Questions regarding the procedure were encouraged and answered.  The patient understands and consents to the procedure.The right femoral region was prepped and draped usual sterile fashion. Maximal barrier sterile technique was utilized including caps, mask, sterile gowns, sterile gloves, sterile drape, hand hygiene and skin antiseptic.  Intravenous Fentanyl and Versed were administered as conscious sedation during continuous cardiorespiratory monitoring by the radiology RN, with a total moderate sedation time of 120 minutes.  Under real time ultrasound guidance, the right common femoral artery was accessed with a 21-gauge  micropuncture needle.  This was exchanged over a 018 guide wire for a transitional dilator, which allowed placement of a Benson wire into the abdominal aorta.  Over this, a 6-French vascular sheath was placed.  Through this, a 5- [place] C2  catheter was advanced in attempts to traverse the aortic bifurcation but because of the relatively acute angulation tortuosity, this was unsuccessful.  Aortography at the level of the bifurcation demonstrates mild tortuosity of the distal abdominal aorta   and common iliac arteries without significant atheromatous irregularity, dissection, or stenosis.  The C2 was exchanged for a Sos catheter, used to place a guide wire across the aortic bifurcation.  Over this, the C2 catheter was advanced.  After selective left internal iliac arteriography in multiple projections, an anterior branch of the internal iliac system supplying the region of active extravasation seen on CT was identified and selectively catheterized with the C2 catheter.  Gelfoam slurry embolization was performed.  Follow-up left internal iliac arteriography demonstrates  cessation of flow into the treated segment, no evident complication.  No active extravasation.  In similar fashion, the C2 catheter was directed into   the right internal iliac artery. Selective right pelvic arteriography in multiple projections was obtained.  An anterior branch supplying the region of active extravasation seen on CT was identified. The branch was selectively catheterized and embolization  performed with a Gelfoam slurry.  Follow-up   right internal iliac arteriography demonstrates cessation of flow in the treated segment.  No evidence of active extravasation or other apparent complication.  The catheter was then removed.  After confirmatory right femoral arteriography, the sheath was removed and hemostasis achieved with the Exoseal device. The patient tolerated the procedure well and remained hemodynamically stable.  No immediate  complication.  IMPRESSION: 1.  Technically successful Gelfoam embolization of the anterior division left internal iliac artery. 2.  Technically successful Gelfoam embolization of the anterior vision right internal iliac artery.   Original Report Authenticated By: Osa Craver, M.D.    Ir Angiogram Follow Up Study  11/18/2011  *RADIOLOGY REPORT*  Clinical Data: 78 year old female with multiple pelvic fractures, pelvic hematoma, evidence of active bilateral extravasation in the anterior pelvis on CT.  She is currently hemodynamically stable.  PELVIC SELECTIVE ARTERIOGRAPHY,IR ULTRASOUND GUIDANCE VASC ACCESS RIGHT,TRANSCATHETER THERAPY EMBOLIZATION,ARTERIOGRAPHY,ADDITIONAL ARTERIOGRAPHY  Comparison: None.  Approach:  Right common femoral artery  Vessels catheterized:  Anterior branch left internal iliac artery (third order), anterior branch right internal iliac artery (second order)  Technique and findings: The procedure, risks (including but not limited to bleeding, infection, organ damage), benefits, and alternatives were explained to the patient and daughter.  Questions regarding the procedure were encouraged and answered.  The patient understands and consents to the procedure.The right femoral region was prepped and draped usual sterile fashion. Maximal barrier sterile technique was utilized including caps, mask, sterile gowns, sterile gloves, sterile drape, hand hygiene and skin antiseptic.  Intravenous Fentanyl and Versed were administered as conscious sedation during continuous cardiorespiratory monitoring by the radiology RN, with a total moderate sedation time of 120 minutes.  Under real time ultrasound guidance, the right common femoral artery was accessed with a 21-gauge micropuncture needle.  This was exchanged over a 018 guide wire for a transitional dilator, which allowed placement of a Benson wire into the abdominal aorta.  Over this, a 6-French vascular sheath was placed.  Through this, a  5- [place] C2 catheter was advanced in attempts to traverse the aortic bifurcation but because of the relatively acute angulation tortuosity, this was unsuccessful.  Aortography at the level of the bifurcation demonstrates mild tortuosity of the distal abdominal aorta   and common iliac arteries without significant atheromatous irregularity, dissection, or stenosis.  The C2 was exchanged for a  Sos catheter, used to place a guide wire across the aortic bifurcation.  Over this, the C2 catheter was advanced.  After selective left internal iliac arteriography in multiple projections, an anterior branch of the internal iliac system supplying the region of active extravasation seen on CT was identified and selectively catheterized with the C2 catheter.  Gelfoam slurry embolization was performed.  Follow-up left internal iliac arteriography demonstrates  cessation of flow into the treated segment, no evident complication.  No active extravasation.  In similar fashion, the C2 catheter was directed into   the right internal iliac artery. Selective right pelvic arteriography in multiple projections was obtained.  An anterior branch supplying the region of active extravasation seen on CT was identified. The branch was selectively catheterized and embolization  performed with a Gelfoam slurry.  Follow-up   right internal iliac arteriography demonstrates cessation of flow in the treated segment.  No evidence of active extravasation or other apparent complication.  The catheter was then removed.  After confirmatory right femoral arteriography, the sheath was removed and hemostasis achieved with the Exoseal device. The patient tolerated the procedure well and remained hemodynamically stable.  No immediate complication.  IMPRESSION: 1.  Technically successful Gelfoam embolization of the anterior division left internal iliac artery. 2.  Technically successful Gelfoam embolization of the anterior vision right internal iliac artery.    Original Report Authenticated By: Osa Craver, M.D.    Ir Angiogram Follow Up Study  11/16/2011  Dayne Oley Balm III, MD     11/16/2011  6:12 PM Bilateral internal iliac artery anterior branch  gelfoam  embolization No complication No blood loss. See complete dictation in [facility].    Ir US Guide Vasc Access Right  11/18/2011  *RADIOLOGY REPORT*  Clinical Data: 78 year old female with multiple pelvic fractures, pelvic hematoma, evidence of active bilateral extravasation in the anterior pelvis on CT.  She is currently hemodynamically stable.  PELVIC SELECTIVE ARTERIOGRAPHY,IR ULTRASOUND GUIDANCE VASC ACCESS RIGHT,TRANSCATHETER THERAPY EMBOLIZATION,ARTERIOGRAPHY,ADDITIONAL ARTERIOGRAPHY  Comparison: None.  Approach:  Right common femoral artery  Vessels catheterized:  Anterior branch left internal iliac artery (third order), anterior branch right internal iliac artery (second order)  Technique and findings: The procedure, risks (including but not limited to bleeding, infection, organ damage), benefits, and alternatives were explained to the patient and daughter.  Questions regarding the procedure were encouraged and answered.  The patient understands and consents to the procedure.The right femoral region was prepped and draped usual sterile fashion. Maximal barrier sterile technique was utilized including caps, mask, sterile gowns, sterile gloves, sterile drape, hand hygiene and skin antiseptic.  Intravenous Fentanyl and Versed were administered as conscious sedation during continuous cardiorespiratory monitoring by the radiology RN, with a total moderate sedation time of 120 minutes.  Under real time ultrasound guidance, the right common femoral artery was accessed with a 21-gauge micropuncture needle.  This was exchanged over a 018 guide wire for a transitional dilator, which allowed placement of a Benson wire into the abdominal aorta.  Over this, a 6-French vascular sheath was placed.   Through this, a 5- [place] C2 catheter was advanced in attempts to traverse the aortic bifurcation but because of the relatively acute angulation tortuosity, this was unsuccessful.  Aortography at the level of the bifurcation demonstrates mild tortuosity of the distal abdominal aorta   and common iliac arteries without significant atheromatous irregularity, dissection, or stenosis.  The C2 was exchanged for a Sos catheter, used to place a guide wire across the aortic  bifurcation.  Over this, the C2 catheter was advanced.  After selective left internal iliac arteriography in multiple projections, an anterior branch of the internal iliac system supplying the region of active extravasation seen on CT was identified and selectively catheterized with the C2 catheter.  Gelfoam slurry embolization was performed.  Follow-up left internal iliac arteriography demonstrates  cessation of flow into the treated segment, no evident complication.  No active extravasation.  In similar fashion, the C2 catheter was directed into   the right internal iliac artery. Selective right pelvic arteriography in multiple projections was obtained.  An anterior branch supplying the region of active extravasation seen on CT was identified. The branch was selectively catheterized and embolization  performed with a Gelfoam slurry.  Follow-up   right internal iliac arteriography demonstrates cessation of flow in the treated segment.  No evidence of active extravasation or other apparent complication.  The catheter was then removed.  After confirmatory right femoral arteriography, the sheath was removed and hemostasis achieved with the Exoseal device. The patient tolerated the procedure well and remained hemodynamically stable.  No immediate complication.  IMPRESSION: 1.  Technically successful Gelfoam embolization of the anterior division left internal iliac artery. 2.  Technically successful Gelfoam embolization of the anterior vision right internal  iliac artery.   Original Report Authenticated By: Osa Craver, M.D.    Dg Abd Acute W/chest  11/16/2011  *RADIOLOGY REPORT*  Clinical Data: Abdominal distention, abdominal pain  ACUTE ABDOMEN SERIES (ABDOMEN 2 VIEW & CHEST 1 VIEW)  Comparison: Abdominal film 03/11/2008  Findings: Mild enlarged cardiac silhouette with ectatic aorta. There is no free air beneath hemidiaphragms.  Lungs are hyperinflated but clear.  The left lateral decubitus view demonstrates no intraperitoneal free air.  No dilated dilated loops of large or small bowel.  There is gas the rectum.  There is a left hip internal fixation.  There is irregularity along the superior pubic rami on the left.  IMPRESSION:  1.  No acute cardiopulmonary findings. 2.  No evidence of bowel obstruction or intraperitoneal free air. 3.  Concern for left pubic rami fracture.  Recommend dedicated views of the pelvis or CT for further evaluation.   Original Report Authenticated By: Genevive Bi, M.D.     Medications: I have reviewed the patient's current medications. Scheduled Meds:    . calcium carbonate  1 tablet Oral Daily  . cholecalciferol  1,000 Units Oral Daily  . demeclocycline  150 mg Oral Q12H  . docusate sodium  100 mg Oral BID  . feeding supplement  1 Container Oral BID BM  . lisinopril  10 mg Oral Daily  . potassium chloride  40 mEq Oral BID  . protein supplement  6 g Oral TID WC  . [DISCONTINUED] amLODipine  5 mg Oral Daily  . [DISCONTINUED] lisinopril  10 mg Oral Daily  . [DISCONTINUED] tolvaptan  15 mg Oral Q24H   Continuous Infusions:    . 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 Received one dose of tolvaptan. Is now on demeclocycline. Will add sodium bicarb tabs. Recheck NA levels in am. Asymptomatic from this.  Pelvic hematoma, female (11/16/2011) She is s/p embolization  and s/p  Transfusion of 2 Units  PRBC's.  Hb  is stable at present. Continue to monitor.  Bilateral pubic rami fractures (11/16/2011) Appreciate Ortho input. PT/OT. OOB as tolerated.   Anemia due to blood loss, acute (11/16/2011) S/PTransfusion of 2 Units  PRBC's.  Hb is stable at present. Continue to monitor.  Atrial Fibrillation Pt has converted back to sinus rhythm.  Hypokalemia:  Replace potassium as necessary.   Leukocytosis  Resolved  HTN (hypertension) BP adequately but not optimally controlled.  Protein Calorie Malnutrition Pt appears chronically malnourished and has had reported poor intake. Appreciate input from nutrition.  Disposition Plan is for DC SNF in am as long as Na levels are stable to increased.      Peggye Pitt, MD [facility] Pager: [phone]   LOS: 10 days

## 2011-11-26 NOTE — Progress Notes (Signed)
CSW continues to follow for discharge planning - patient has a bed at [facility] when stable for discharge. CSW spoke with patient's daughter, Raynelle Fanning (cell#: [phone]) yesterday.[employer] authorization obtained. CSW will follow-up tomorrow.   Clinical Social Work Department CLINICAL SOCIAL WORK PLACEMENT NOTE 11/26/2011  Patient:  Sharon Harrison, Sharon Harrison  Account Number:  [id number] Admit date:  11/16/2011  Clinical Social Worker:  Orpah Greek  Date/time:  11/18/2011 11:48 AM  Clinical Social Work is seeking post-discharge placement for this patient at the following level of care:   SKILLED NURSING   (*CSW will update this form in Epic as items are completed)   11/18/2011  Patient/family provided with [facility] Department of Clinical Social Work's list of facilities offering this level of care within the geographic area requested by the patient (or if unable, by the patient's family).  11/18/2011  Patient/family informed of their freedom to choose among providers that offer the needed level of care, that participate in Medicare, Medicaid or managed care program needed by the patient, have an available bed and are willing to accept the patient.  11/18/2011  Patient/family informed of [facility]' ownership interest in [facility], as well as of the fact that they are under no obligation to receive care at this facility.  PASARR submitted to EDS on 11/18/2011 PASARR number received from EDS on 11/18/2011  FL2 transmitted to all facilities in geographic area requested by pt/family on  11/18/2011 FL2 transmitted to all facilities within larger geographic area on   Patient informed that his/her managed care company has contracts with or will negotiate with  certain facilities, including the following:     Patient/family informed of bed offers received:  11/20/2011 Patient chooses bed at [facility] Physician recommends  and patient chooses bed at    Patient to be transferred to [facility]   Patient to be transferred to facility by   The following physician request were entered in Epic:   Additional Comments:  Unice Bailey, LCSW [facility] Clinical Social Worker cell #: [phone]

## 2011-11-26 NOTE — Progress Notes (Signed)
Physical Therapy Treatment Patient Details Name: Sharon Harrison MRN: [id number] DOB: 1915/01/03 Today's Date: 11/26/2011 Time: 4098-1191 PT Time Calculation (min): 32 min  PT Assessment / Plan / Recommendation Comments on Treatment Session  Pt not progressing with mobility and continues to require +2 for all mobility.  She states that she is just "ready to die" and doesn't want to get OOB again.      Follow Up Recommendations  SNF     Does the patient have the potential to tolerate intense rehabilitation  No, Recommend SNF  Barriers to Discharge        Equipment Recommendations  None recommended by OT    Recommendations for Other Services    Frequency Min 3X/week   Plan Discharge plan remains appropriate    Precautions / Restrictions Precautions Precautions: Fall Precaution Comments: pelvic fx Restrictions Weight Bearing Restrictions: No RLE Weight Bearing: Weight bearing as tolerated LLE Weight Bearing: Weight bearing as tolerated   Pertinent Vitals/Pain 5/10 pain    Mobility  Bed Mobility Bed Mobility: Supine to Sit;Sitting - Scoot to Edge of Bed Supine to Sit: 1: +2 Total assist Supine to Sit: Patient Percentage: 0% Sitting - Scoot to Edge of Bed: 1: +2 Total assist Sitting - Scoot to Edge of Bed: Patient Percentage: 10% Details for Bed Mobility Assistance: Pt did not want to attempt rolling, therefore assist pts LEs out of bed and trunk to attain sitting position.  Provided cues for hand placement and technique, however pt unable to follow commands due to pain and anxiety.  Transfers Transfers: Sit to Stand;Stand to Sit;Stand Pivot Transfers Sit to Stand: 1: +2 Total assist;From bed Sit to Stand: Patient Percentage: 10% Stand to Sit: 1: +2 Total assist;To chair/3-in-1 Stand to Sit: Patient Percentage: 10% Stand Pivot Transfers: 1: +2 Total assist Stand Pivot Transfers: Patient Percentage: 0% Details for Transfer Assistance: Performed sit to stand x 3 reps in  order to assist with self care.  Each time requires +2 assist due to pt demonstrating posterior leaning.  Max cues for hand placement and forward leaning/glute activation for upright stance. Attempted to take some steps from bed to chair, however pt unable to weight shift or perform LE advancement, so assist more squat pivot transfer to chair.   Ambulation/Gait Ambulation/Gait Assistance: Not tested (comment) Assistive device: Rolling walker Gait Pattern: Wide base of support    Exercises     PT Diagnosis:    PT Problem List:   PT Treatment Interventions:     PT Goals Acute Rehab PT Goals PT Goal Formulation: With patient/family Time For Goal Achievement: 12/01/11 Potential to Achieve Goals: Fair Pt will go Supine/Side to Sit: with min assist PT Goal: Supine/Side to Sit - Progress: Not progressing Pt will Sit at [facility] of Bed: with min assist;6-10 min;with bilateral upper extremity support PT Goal: Sit at Edge Of Bed - Progress: Progressing toward goal Pt will go Sit to Stand: with min assist PT Goal: Sit to Stand - Progress: Not progressing Pt will go Stand to Sit: with min assist PT Goal: Stand to Sit - Progress: Not progressing Pt will Ambulate: 1 - 15 feet;with mod assist;with least restrictive assistive device PT Goal: Ambulate - Progress: Not progressing  Visit Information  Last PT Received On: 11/26/11 Assistance Needed: +2    Subjective Data  Subjective: I don't want to have to get out of this bed.  Patient Stated Goal: none stated   Cognition  Overall Cognitive Status: No family/caregiver present to  determine baseline cognitive functioning Arousal/Alertness: Awake/alert Orientation Level: Disoriented to;Time Behavior During Session: Anxious Cognition - Other Comments: Pt states that she is just "ready to die" and that she "doesn't want to ever get out of the bed again" when she gets back.     Balance     End of Session PT - End of Session Equipment Utilized During  Treatment: Gait belt Activity Tolerance: Patient limited by pain;Patient limited by fatigue Patient left: with call bell/phone within reach;in chair;with chair alarm set Nurse Communication: Mobility status   GP     Page, Meribeth Mattes 11/26/2011, 3:48 PM

## 2011-11-27 DIAGNOSIS — E222 Syndrome of inappropriate secretion of antidiuretic hormone: Secondary | ICD-10-CM

## 2011-11-27 DIAGNOSIS — E46 Unspecified protein-calorie malnutrition: Secondary | ICD-10-CM

## 2011-11-27 DIAGNOSIS — I4891 Unspecified atrial fibrillation: Secondary | ICD-10-CM

## 2011-11-27 LAB — RENAL FUNCTION PANEL
CO2: 27 mEq/L (ref 19–32)
Calcium: 8.9 mg/dL (ref 8.4–10.5)
Creatinine, Ser: 0.48 mg/dL — ABNORMAL LOW (ref 0.50–1.10)
GFR calc non Af Amer: 81 mL/min — ABNORMAL LOW (ref >90)
Glucose, Bld: 104 mg/dL — ABNORMAL HIGH (ref 70–99)
Phosphorus: 2.9 mg/dL (ref 2.3–4.6)

## 2011-11-27 MED ORDER — DEMECLOCYCLINE HCL 150 MG PO TABS: 150.0000 mg | ORAL_TABLET | Freq: Two times a day (BID) | ORAL | Status: AC

## 2011-11-27 MED ORDER — SODIUM BICARBONATE 650 MG PO TABS: 650.0000 mg | ORAL_TABLET | Freq: Two times a day (BID) | ORAL | Status: AC

## 2011-11-27 MED ORDER — DSS 100 MG PO CAPS: 100.0000 mg | ORAL_CAPSULE | Freq: Two times a day (BID) | ORAL | Status: AC

## 2011-11-27 MED ORDER — BOOST / RESOURCE BREEZE PO LIQD: 1.0000 | Freq: Two times a day (BID) | ORAL | Status: AC

## 2011-11-27 MED ORDER — RESOURCE INSTANT PROTEIN PO PWD PACKET: 6.0000 g | Freq: Three times a day (TID) | ORAL | Status: AC

## 2011-11-27 MED ORDER — ACETAMINOPHEN 325 MG PO TABS: 650.0000 mg | ORAL_TABLET | Freq: Four times a day (QID) | ORAL | Status: AC | PRN

## 2011-11-27 MED ORDER — HYDROCODONE-ACETAMINOPHEN 5-325 MG PO TABS: 1.0000 | ORAL_TABLET | ORAL | Status: AC | PRN

## 2011-11-27 NOTE — Progress Notes (Signed)
Patient is set to discharge to [facility] today. Patient's daughter, Sharon Harrison (cell#: [phone]) made aware. [facility] called for transport.   Clinical Social Work Department CLINICAL SOCIAL WORK PLACEMENT NOTE 11/27/2011  Patient:  Sharon Harrison, Sharon Harrison  Account Number:  [id number] Admit date:  11/16/2011  Clinical Social Worker:  Orpah Greek  Date/time:  11/18/2011 11:48 AM  Clinical Social Work is seeking post-discharge placement for this patient at the following level of care:   SKILLED NURSING   (*CSW will update this form in Epic as items are completed)   11/18/2011  Patient/family provided with [facility] Department of Clinical Social Work's list of facilities offering this level of care within the geographic area requested by the patient (or if unable, by the patient's family).  11/18/2011  Patient/family informed of their freedom to choose among providers that offer the needed level of care, that participate in Medicare, Medicaid or managed care program needed by the patient, have an available bed and are willing to accept the patient.  11/18/2011  Patient/family informed of [facility]' ownership interest in [facility], as well as of the fact that they are under no obligation to receive care at this facility.  PASARR submitted to EDS on 11/18/2011 PASARR number received from EDS on 11/18/2011  FL2 transmitted to all facilities in geographic area requested by pt/family on  11/18/2011 FL2 transmitted to all facilities within larger geographic area on   Patient informed that his/her managed care company has contracts with or will negotiate with  certain facilities, including the following:     Patient/family informed of bed offers received:  11/20/2011 Patient chooses bed at [facility] Physician recommends and patient chooses bed at    Patient to be transferred to [facility] on  11/27/2011 Patient to be  transferred to facility by [facility]  The following physician request were entered in Epic:   Additional Comments:  Unice Bailey, LCSW [facility] Clinical Social Worker cell #: [phone]

## 2011-11-27 NOTE — Discharge Summary (Signed)
Physician Discharge Summary  Patient ID: Sharon Harrison MRN: [id number] DOB/AGE: [birth year] 76 y.o.  Admit date: 12/10/11 Discharge date: 11/27/2011  Primary Care Physician:  Cala Bradford, MD   Discharge Diagnoses:    Principal Problem:  *Pelvic hematoma, female Active Problems:  Bilateral pubic rami fractures  Anemia due to blood loss, acute  Osteoporosis  HTN (hypertension)  Leukocytosis  Hyponatremia  Hypokalemia  Protein calorie malnutrition  Atrial fibrillation  SIADH (syndrome of inappropriate ADH production)      Medication List     As of 11/27/2011 10:03 AM    STOP taking these medications         aspirin EC 81 MG tablet      PRESERVISION/LUTEIN PO      TAKE these medications         acetaminophen 325 MG tablet   Commonly known as: TYLENOL   Take 2 tablets (650 mg total) by mouth every 6 (six) hours as needed (or Fever >/= 101).      calcium carbonate 500 MG chewable tablet   Commonly known as: TUMS - dosed in mg elemental calcium   Chew 1 tablet by mouth daily.      cholecalciferol 1000 UNITS tablet   Commonly known as: VITAMIN D   Take 1,000 Units by mouth daily.      demeclocycline 150 MG tablet   Commonly known as: DECLOMYCIN   Take 1 tablet (150 mg total) by mouth every 12 (twelve) hours.      DSS 100 MG Caps   Take 100 mg by mouth 2 (two) times daily.      feeding supplement Liqd   Take 1 Container by mouth 2 (two) times daily between meals.      HYDROcodone-acetaminophen 5-325 MG per tablet   Commonly known as: NORCO/VICODIN   Take 1-2 tablets by mouth every 4 (four) hours as needed.      lisinopril 10 MG tablet   Commonly known as: PRINIVIL,ZESTRIL   Take 10 mg by mouth daily.      protein supplement 6 g Powd   Commonly known as: RESOURCE BENEPROTEIN   Take 1 scoop (6 g total) by mouth 3 (three) times daily with meals.      raloxifene 60 MG tablet   Commonly known as: EVISTA   Take 60 mg by mouth daily.      sodium  bicarbonate 650 MG tablet   Take 1 tablet (650 mg total) by mouth 2 (two) times daily.           Disposition and Follow-up:  Will be discharged to SNF today in stable and improved condition. Needs to follow up with Dr. Luiz Blare (ortho) in 2 weeks. I have also recommended she follow up with her PCP in 2 weeks as well.  Consults:  Orthopedics, Dr. Luiz Blare          Neprhology, Dr. Arlean Hopping                         Interventional Radiology, Dr. Deanne Coffer   Significant Diagnostic Studies:  Acute Abdominal Series 2022-12-10: IMPRESSION:  1. No acute cardiopulmonary findings.  2. No evidence of bowel obstruction or intraperitoneal free air.  3. Concern for left pubic rami fracture. Recommend dedicated  views of the pelvis or CT for further evaluation.  CT Abd/Pelvis 10-Dec-2022: IMPRESSION:  1. Acute fractures of superior and inferior ischiopubic rami, with  a moderately large pelvic hematoma and evidence of bilateral  active extravasation.  I telephoned the critical test results to Dr. Fredderick Phenix at the time of  interpretation.  2. Minimally-displaced fracture of the left sacral ala.  3. Multiple thoracolumbar compression fracture deformities,  progressive since 03/11/2008.    Brief H and P: For complete details please refer to admission H and P, but in brief patient is a 78 year old, Caucasian female, who lives in an independent living facility. Patient apparently fell sometime this morning, while she was walking towards the bathroom. The daughter tells me that the patient told her that she did not pass out. It was a mechanical fall. She tried to crawl back into the bed and was found by the caregiver slumped over the bed. There is no history of any recent illness. No fever, chills, nausea, vomiting. Patient denies any chest pain at this time. Denies any shortness of breath. No recent diarrhea. No recent weakness. She does have severe osteoporosis with multiple spinal compression fractures. She uses a  wheelchair and sometimes a walker. It is surprising that she lives in independent living facility. Patient complains of pain in both her hip areas and the lower part of the abdomen. The pain is more so, when she is moved around. She is unable to characterize the pain. She's unable to quantify the pain at this time. No obvious issues with urinating. We were asked to admit her for further evaluation and management.     Hospital Course:  Principal Problem:  *Pelvic hematoma, female Active Problems:  Bilateral pubic rami fractures  Anemia due to blood loss, acute  Osteoporosis  HTN (hypertension)  Leukocytosis  Hyponatremia  Hypokalemia  Protein calorie malnutrition  Atrial fibrillation  SIADH (syndrome of inappropriate ADH production)   Hyponatremia  Likely 2/2 SIADH or low solute diet (tea and toast diet). Received one dose of tolvaptan. Is now on demeclocycline and sodium bicarb tabs. Na levels have been stable between 125-127 for 3 days. She is asymptomatic from this.  Pelvic hematoma, female  She is s/p embolization and s/p Transfusion of 2 Units PRBC's. Hb is stable.  Bilateral pubic rami fractures   Appreciate Ortho input. PT/OT. OOB as tolerated.  Needs to follow up with Dr. Luiz Blare in 2 weeks per his recommendations. Weight bearing as tolerated.  Anemia due to blood loss, acute  S/P transfusion of 2 Units PRBC's. Hb is stable..   Atrial Fibrillation  Pt has converted back to sinus rhythm. Not a candidate for anticoagulation given falls and large pelvic hematoma.  Hypokalemia:  Repleted.  Leukocytosis  Resolved. No signs of active infection.  HTN (hypertension) BP adequately controlled.  Protein Calorie Malnutrition  Pt appears chronically malnourished and has had reported poor intake. On resource and beneprotein supplements.   Time spent on Discharge: Greater than 30 minutes.  SignedChaya Jan [facility] Pager: [phone] 11/27/2011,  10:03 AM

## 2013-12-19 ENCOUNTER — Encounter (HOSPITAL_COMMUNITY): Payer: Self-pay | Admitting: *Deleted

## 2013-12-19 ENCOUNTER — Emergency Department (HOSPITAL_COMMUNITY)
Admission: EM | Admit: 2013-12-19 | Discharge: 2013-12-19 | Disposition: A | Discharge status: Home / Self Care | Payer: Medicare Other | Attending: Emergency Medicine | Admitting: Emergency Medicine

## 2013-12-19 ENCOUNTER — Emergency Department (HOSPITAL_COMMUNITY): Payer: Medicare Other

## 2013-12-19 DIAGNOSIS — N390 Urinary tract infection, site not specified: Secondary | ICD-10-CM | POA: Insufficient documentation

## 2013-12-19 DIAGNOSIS — Z8781 Personal history of (healed) traumatic fracture: Secondary | ICD-10-CM | POA: Insufficient documentation

## 2013-12-19 DIAGNOSIS — I10 Essential (primary) hypertension: Secondary | ICD-10-CM | POA: Diagnosis not present

## 2013-12-19 DIAGNOSIS — Z79899 Other long term (current) drug therapy: Secondary | ICD-10-CM | POA: Diagnosis not present

## 2013-12-19 DIAGNOSIS — M545 Low back pain: Secondary | ICD-10-CM | POA: Diagnosis not present

## 2013-12-19 DIAGNOSIS — M549 Dorsalgia, unspecified: Secondary | ICD-10-CM

## 2013-12-19 LAB — URINALYSIS, ROUTINE W REFLEX MICROSCOPIC
BILIRUBIN URINE: NEGATIVE
Glucose, UA: NEGATIVE mg/dL
HGB URINE DIPSTICK: NEGATIVE
KETONES UR: NEGATIVE mg/dL
NITRITE: NEGATIVE
PROTEIN: 30 mg/dL — AB
SPECIFIC GRAVITY, URINE: 1.021 (ref 1.005–1.030)
UROBILINOGEN UA: 1 mg/dL (ref 0.0–1.0)
pH: 7.5 (ref 5.0–8.0)

## 2013-12-19 LAB — URINE MICROSCOPIC-ADD ON

## 2013-12-19 MED ORDER — CEPHALEXIN 250 MG PO CAPS: 250.0000 mg | ORAL_CAPSULE | Freq: Four times a day (QID) | ORAL | Status: AC

## 2013-12-19 MED ORDER — ACETAMINOPHEN 325 MG PO TABS
650.0000 mg | ORAL_TABLET | Freq: Once | ORAL | Status: AC
  Administered 2013-12-19: 650 mg via ORAL
  Filled 2013-12-19: qty 2

## 2013-12-19 MED ORDER — CEPHALEXIN 250 MG PO CAPS
250.0000 mg | ORAL_CAPSULE | Freq: Once | ORAL | Status: AC
  Administered 2013-12-19: 250 mg via ORAL
  Filled 2013-12-19: qty 1

## 2013-12-19 NOTE — ED Notes (Signed)
[facility] called for transport.  

## 2013-12-19 NOTE — ED Provider Notes (Addendum)
CSN: [id number]     Arrival date & time 12/19/13  1632 History   First MD Initiated Contact with Patient 12/19/13 1708     Chief Complaint  Patient presents with  . Back Pain     (Consider location/radiation/quality/duration/timing/severity/associated sxs/prior Treatment) Patient is a 80 y.o. female presenting with back pain. The history is provided by the patient and a relative.  Back Pain Location:  Lumbar spine Quality:  Stabbing Radiates to:  Does not radiate Pain severity now: was severe pain but now she has no pain. Onset quality:  Sudden Duration:  2 hours Timing:  Intermittent Progression:  Resolved Chronicity:  Recurrent Context comment:  She pivoted from the wheelchair to the toilet and when she sat down got a severe pain in her back.   Relieved by:  Lying down and being still Worsened by:  Movement and standing Ineffective treatments:  None tried Associated symptoms: no abdominal pain, no dysuria, no fever, no leg pain, no numbness, no paresthesias, no tingling and no weakness   Risk factors: hx of osteoporosis   Risk factors comment:  Hx of spinal fusion 70 years ago and recurrent compression fx   Past Medical History  Diagnosis Date  . Osteoporosis   . Compression fracture   . Hypertension   . Pulmonary hypertension   . Mitral valve regurgitation    Past Surgical History  Procedure Laterality Date  . Femur fracture surgery    . Spinal fusion     History reviewed. No pertinent family history. History  Substance Use Topics  . Smoking status: Never Smoker   . Smokeless tobacco: Not on file  . Alcohol Use: No   OB History    No data available     Review of Systems  Constitutional: Negative for fever.  Gastrointestinal: Negative for abdominal pain.  Genitourinary: Negative for dysuria.  Musculoskeletal: Positive for back pain.  Neurological: Negative for tingling, weakness, numbness and paresthesias.  All other systems reviewed and are  negative.     Allergies  Codeine  Home Medications   Prior to Admission medications   Medication Sig Start Date End Date Taking? Authorizing Provider  acetaminophen (TYLENOL) 325 MG tablet Take 2 tablets (650 mg total) by mouth every 6 (six) hours as needed (or Fever >/= 101). 11/27/11   Henderson CloudEstela Y Hernandez Acosta, MD  calcium carbonate (TUMS - DOSED IN MG ELEMENTAL CALCIUM) 500 MG chewable tablet Chew 1 tablet by mouth daily.    Historical Provider, MD  cholecalciferol (VITAMIN D) 1000 UNITS tablet Take 1,000 Units by mouth daily.    Historical Provider, MD  demeclocycline (DECLOMYCIN) 150 MG tablet Take 1 tablet (150 mg total) by mouth every 12 (twelve) hours. 11/27/11   Henderson CloudEstela Y Hernandez Acosta, MD  docusate sodium 100 MG CAPS Take 100 mg by mouth 2 (two) times daily. 11/27/11   Henderson CloudEstela Y Hernandez Acosta, MD  feeding supplement (RESOURCE BREEZE) LIQD Take 1 Container by mouth 2 (two) times daily between meals. 11/27/11   Henderson CloudEstela Y Hernandez Acosta, MD  HYDROcodone-acetaminophen (NORCO/VICODIN) 5-325 MG per tablet Take 1-2 tablets by mouth every 4 (four) hours as needed. 11/27/11   Henderson CloudEstela Y Hernandez Acosta, MD  lisinopril (PRINIVIL,ZESTRIL) 10 MG tablet Take 10 mg by mouth daily.    Historical Provider, MD  protein supplement (RESOURCE BENEPROTEIN) 6 g POWD Take 1 scoop (6 g total) by mouth 3 (three) times daily with meals. 11/27/11   Henderson CloudEstela Y Hernandez Acosta, MD  raloxifene (EVISTA) 60 MG  tablet Take 60 mg by mouth daily.    Historical Provider, MD  sodium bicarbonate 650 MG tablet Take 1 tablet (650 mg total) by mouth 2 (two) times daily. 11/27/11   Henderson CloudEstela Y Hernandez Acosta, MD   BP 176/95 mmHg  Pulse 78  Temp(Src) 98.7 F (37.1 C) (Oral)  Resp 22  SpO2 97% Physical Exam  Constitutional: She is oriented to person, place, and time. She appears well-developed and well-nourished. No distress.  HENT:  Head: Normocephalic and atraumatic.  Mouth/Throat: Oropharynx is clear and moist.   Eyes: Conjunctivae and EOM are normal. Pupils are equal, round, and reactive to light.  Neck: Normal range of motion. Neck supple.  Cardiovascular: Normal rate, regular rhythm and intact distal pulses.   Murmur heard.  Systolic murmur is present with a grade of 3/6  Heard best at the LSB  Pulmonary/Chest: Effort normal and breath sounds normal. No respiratory distress. She has no wheezes. She has no rales.  Abdominal: Soft. She exhibits no distension. There is no tenderness. There is no rebound and no guarding.  Musculoskeletal: Normal range of motion. She exhibits no edema or tenderness.  Kyphosis and scoliosis. No point tenderness in the lumbar spine. Patient able to sit up without pain  Neurological: She is alert and oriented to person, place, and time.  Normal sensation and movement in bilateral legs  Skin: Skin is warm and dry. No rash noted. No erythema.  Psychiatric: She has a normal mood and affect. Her behavior is normal.  Nursing note and vitals reviewed.   ED Course  Procedures (including critical care time) Labs Review Labs Reviewed  URINALYSIS, ROUTINE W REFLEX MICROSCOPIC - Abnormal; Notable for the following:    APPearance TURBID (*)    Protein, ur 30 (*)    Leukocytes, UA LARGE (*)    All other components within normal limits  URINE MICROSCOPIC-ADD ON - Abnormal; Notable for the following:    Bacteria, UA MANY (*)    Casts HYALINE CASTS (*)    Crystals TRIPLE PHOSPHATE CRYSTALS (*)    All other components within normal limits    Imaging Review Dg Lumbar Spine Complete  12/19/2013   CLINICAL DATA:  Low back pain.  EXAM: LUMBAR SPINE - COMPLETE 4+ VIEW  COMPARISON:  None.  FINDINGS: Bones are diffusely osteopenic. There are compression deformities at T11, T12, L1, L2, L4 and L5. The L3 level is the only spared level. Overall loss of height appears similar to sagittal reconstructions by CT in 2013 and a new fracture is not definitively identified. No evidence of  subluxation. No bony lesions or destruction identified.  IMPRESSION: Diffuse osteopenia with numerous compression fractures of the lower thoracic and lumbar spine. Appearance is similar to CT in 2013 and no acute new fracture is identified.   Electronically Signed   By: Irish LackGlenn  Yamagata M.D.   On: 12/19/2013 19:11     EKG Interpretation None      MDM   Final diagnoses:  Back pain  UTI (lower urinary tract infection)    Patient who is 80 years old and had an abrupt onset of severe mid lumbar pain when she pivoted to sit on the toilet today. Patient denied any pain going down into her legs numbness or tingling. She has no abdominal tenderness and denies any urinary symptoms. She has a significant history for spinal fusion approximately 70 years ago as well as recurrent compression fractures. She is not taking any medication for the pain.  On  exam unable to reproduce the pain with palpation. She has normal sensation and movement of her legs. She is otherwise well-appearing and in no acute distress. Will get a lumbar x-ray to evaluate for new compression fracture. Also UA pending. Patient rolled to get a urine and did not have significant pain.  8:41 PM Imaging neg for new compression fx.  UA consistent with UTI.  Treated with keflex and d/ced home.  Gwyneth Sprout, MD 12/19/13 [id number]  Gwyneth Sprout, MD 12/19/13 2043

## 2013-12-19 NOTE — ED Notes (Signed)
Patient with c/o chronic back pain that "flared up" today, so family called EMS--patient did not want to come Patient with hx of spinal fusion and chronic back pain

## 2013-12-19 NOTE — ED Notes (Signed)
Bed: WA13 Expected date:  Expected time:  Means of arrival:  Comments: EMS/back pain 

## 2013-12-19 NOTE — ED Notes (Signed)
Patient taken to radiology 

## 2013-12-21 LAB — URINE CULTURE: Colony Count: >=100000

## 2015-05-21 DEATH — deceased
